# Patient Record
Sex: Female | Born: 1935 | Race: White | Hispanic: No | Marital: Married | Smoking: Never smoker
Health system: Southern US, Community
[De-identification: ages and names within clinical notes are randomized; demographics above are authoritative.]

## PROBLEM LIST (undated history)

## (undated) DIAGNOSIS — I509 Heart failure, unspecified: Secondary | ICD-10-CM

## (undated) DIAGNOSIS — K219 Gastro-esophageal reflux disease without esophagitis: Secondary | ICD-10-CM

## (undated) DIAGNOSIS — N289 Disorder of kidney and ureter, unspecified: Secondary | ICD-10-CM

## (undated) DIAGNOSIS — M1990 Unspecified osteoarthritis, unspecified site: Secondary | ICD-10-CM

## (undated) DIAGNOSIS — I10 Essential (primary) hypertension: Secondary | ICD-10-CM

## (undated) DIAGNOSIS — R040 Epistaxis: Secondary | ICD-10-CM

## (undated) DIAGNOSIS — E785 Hyperlipidemia, unspecified: Secondary | ICD-10-CM

## (undated) HISTORY — DX: Gastro-esophageal reflux disease without esophagitis: K21.9

## (undated) HISTORY — PX: KNEE SURGERY: SHX244

## (undated) HISTORY — PX: TOE SURGERY: SHX1073

## (undated) HISTORY — DX: Epistaxis: R04.0

## (undated) HISTORY — PX: CATARACT EXTRACTION: SUR2

## (undated) HISTORY — PX: TOTAL ABDOMINAL HYSTERECTOMY W/ BILATERAL SALPINGOOPHORECTOMY: SHX83

## (undated) HISTORY — DX: Unspecified osteoarthritis, unspecified site: M19.90

## (undated) HISTORY — DX: Essential (primary) hypertension: I10

## (undated) HISTORY — DX: Hyperlipidemia, unspecified: E78.5

---

## 1997-08-25 ENCOUNTER — Other Ambulatory Visit: Admission: RE | Admit: 1997-08-25 | Discharge: 1997-08-25 | Payer: Self-pay | Admitting: *Deleted

## 1998-11-02 ENCOUNTER — Encounter: Admission: RE | Admit: 1998-11-02 | Discharge: 1998-11-02 | Payer: Self-pay | Admitting: *Deleted

## 1999-11-04 ENCOUNTER — Encounter: Admission: RE | Admit: 1999-11-04 | Discharge: 1999-11-04 | Payer: Self-pay | Admitting: Family Medicine

## 1999-11-04 ENCOUNTER — Encounter: Payer: Self-pay | Admitting: Family Medicine

## 1999-11-10 ENCOUNTER — Ambulatory Visit (HOSPITAL_COMMUNITY): Admission: RE | Admit: 1999-11-10 | Discharge: 1999-11-10 | Payer: Self-pay | Admitting: Gastroenterology

## 2000-11-16 ENCOUNTER — Encounter: Payer: Self-pay | Admitting: Family Medicine

## 2000-11-16 ENCOUNTER — Encounter: Admission: RE | Admit: 2000-11-16 | Discharge: 2000-11-16 | Payer: Self-pay | Admitting: Family Medicine

## 2001-07-12 ENCOUNTER — Encounter: Payer: Self-pay | Admitting: Obstetrics and Gynecology

## 2001-07-17 ENCOUNTER — Inpatient Hospital Stay (HOSPITAL_COMMUNITY): Admission: RE | Admit: 2001-07-17 | Discharge: 2001-07-20 | Payer: Self-pay | Admitting: Obstetrics and Gynecology

## 2003-01-30 ENCOUNTER — Other Ambulatory Visit: Admission: RE | Admit: 2003-01-30 | Discharge: 2003-01-30 | Payer: Self-pay | Admitting: Obstetrics and Gynecology

## 2004-04-08 ENCOUNTER — Emergency Department (HOSPITAL_COMMUNITY): Admission: EM | Admit: 2004-04-08 | Discharge: 2004-04-08 | Payer: Self-pay | Admitting: Emergency Medicine

## 2005-07-27 ENCOUNTER — Emergency Department (HOSPITAL_COMMUNITY): Admission: EM | Admit: 2005-07-27 | Discharge: 2005-07-28 | Payer: Self-pay | Admitting: Emergency Medicine

## 2009-07-13 ENCOUNTER — Inpatient Hospital Stay (HOSPITAL_COMMUNITY): Admission: EM | Admit: 2009-07-13 | Discharge: 2009-07-15 | Payer: Self-pay | Admitting: Emergency Medicine

## 2009-07-13 ENCOUNTER — Ambulatory Visit: Payer: Self-pay | Admitting: Vascular Surgery

## 2009-07-13 ENCOUNTER — Encounter (INDEPENDENT_AMBULATORY_CARE_PROVIDER_SITE_OTHER): Payer: Self-pay | Admitting: Emergency Medicine

## 2009-08-09 ENCOUNTER — Inpatient Hospital Stay (HOSPITAL_COMMUNITY): Admission: AD | Admit: 2009-08-09 | Discharge: 2009-08-13 | Payer: Self-pay | Admitting: Cardiology

## 2009-08-09 ENCOUNTER — Ambulatory Visit: Payer: Self-pay | Admitting: Diagnostic Radiology

## 2009-08-09 ENCOUNTER — Encounter: Payer: Self-pay | Admitting: Emergency Medicine

## 2009-08-09 ENCOUNTER — Ambulatory Visit: Payer: Self-pay | Admitting: Internal Medicine

## 2009-08-10 ENCOUNTER — Encounter: Payer: Self-pay | Admitting: Cardiology

## 2009-09-15 DIAGNOSIS — I10 Essential (primary) hypertension: Secondary | ICD-10-CM | POA: Insufficient documentation

## 2009-09-15 DIAGNOSIS — M129 Arthropathy, unspecified: Secondary | ICD-10-CM | POA: Insufficient documentation

## 2009-09-15 DIAGNOSIS — R040 Epistaxis: Secondary | ICD-10-CM | POA: Insufficient documentation

## 2009-09-15 DIAGNOSIS — R079 Chest pain, unspecified: Secondary | ICD-10-CM

## 2009-09-15 DIAGNOSIS — K219 Gastro-esophageal reflux disease without esophagitis: Secondary | ICD-10-CM

## 2009-09-15 DIAGNOSIS — E785 Hyperlipidemia, unspecified: Secondary | ICD-10-CM | POA: Insufficient documentation

## 2009-09-20 ENCOUNTER — Ambulatory Visit: Payer: Self-pay | Admitting: Cardiology

## 2009-09-20 DIAGNOSIS — I428 Other cardiomyopathies: Secondary | ICD-10-CM | POA: Insufficient documentation

## 2009-09-22 ENCOUNTER — Encounter (INDEPENDENT_AMBULATORY_CARE_PROVIDER_SITE_OTHER): Payer: Self-pay | Admitting: *Deleted

## 2009-10-08 ENCOUNTER — Ambulatory Visit (HOSPITAL_COMMUNITY): Admission: RE | Admit: 2009-10-08 | Discharge: 2009-10-08 | Payer: Self-pay | Admitting: Cardiology

## 2009-10-08 ENCOUNTER — Ambulatory Visit: Payer: Self-pay

## 2009-10-08 ENCOUNTER — Encounter: Payer: Self-pay | Admitting: Cardiology

## 2009-10-08 ENCOUNTER — Ambulatory Visit: Payer: Self-pay | Admitting: Internal Medicine

## 2009-11-17 ENCOUNTER — Encounter: Payer: Self-pay | Admitting: Cardiology

## 2010-02-20 LAB — CONVERTED CEMR LAB
ALT: 8 units/L (ref 0–35)
AST: 19 units/L (ref 0–37)
Albumin: 3.9 g/dL (ref 3.5–5.2)
Alkaline Phosphatase: 49 units/L (ref 39–117)
BUN: 28 mg/dL — ABNORMAL HIGH (ref 6–23)
Bilirubin, Direct: 0.2 mg/dL (ref 0.0–0.3)
CO2: 30 meq/L (ref 19–32)
Calcium: 9.4 mg/dL (ref 8.4–10.5)
Chloride: 103 meq/L (ref 96–112)
Cholesterol: 162 mg/dL (ref 0–200)
Creatinine, Ser: 0.7 mg/dL (ref 0.4–1.2)
GFR calc non Af Amer: 87.16 mL/min (ref >60)
Glucose, Bld: 92 mg/dL (ref 70–99)
HDL: 45.7 mg/dL (ref >39.00)
LDL Cholesterol: 104 mg/dL — ABNORMAL HIGH (ref 0–99)
Potassium: 4 meq/L (ref 3.5–5.1)
Sodium: 141 meq/L (ref 135–145)
Total Bilirubin: 0.6 mg/dL (ref 0.3–1.2)
Total CHOL/HDL Ratio: 4
Total Protein: 6.9 g/dL (ref 6.0–8.3)
Triglycerides: 64 mg/dL (ref 0.0–149.0)
VLDL: 12.8 mg/dL (ref 0.0–40.0)

## 2010-02-22 NOTE — Miscellaneous (Signed)
Summary: [facility] Documentation   [facility] Face to Face Documentation   Imported By: Roderic Ovens 11/29/2009 09:05:50  _____________________________________________________________________  External Attachment:    Type:   Image     Comment:   External Document

## 2010-02-22 NOTE — Assessment & Plan Note (Signed)
Summary: eph per Bjorn Loser call/lg   Visit Type:  New Pt Primary Provider:  Dr. [name]  CC:  no complaints.  History of Present Illness: Pleasant female recently admitted with a non-ST elevation myocardial infarction. Cardiac catheterization performed in July 2011 revealed an ejection fraction of 45% but no obstructive coronary disease. It was felt that she had a possible takotsubo cardiomyopathy. An echocardiogram in July of 2011 showed an ejection fraction of 40-45% with apical hypokinesis. There was mild mitral regurgitation.  She has been treated medically. Since discharge the patient denies any dyspnea on exertion, orthopnea, PND, palpitations, syncope or chest pain. She does have chronic pedal edema which is unchanged.   Allergies (verified): 1)  ! Penicillin 2)  ! Sulfa 3)  ! Latex Exam Gloves (Disposable Gloves)  Past History:  Family History: Last updated: 2009-10-11  Mother died at the young age from colon cancer.  Father   with no known coronary artery disease.  She does not have any siblings.   No history of premature coronary artery disease or sudden cardiac death.   Social History: Last updated: 10-11-09   She [place] in [place], [place] with her husband   and 11 outdoor cats.  She does not use any tobacco products and is a   lifelong nonsmoker.  She does not use any drugs and denies any EtOH.   Her only exercise is physical therapy about 3 times a week.  She walks   with a walker and really has not been out window-shopping, not in some 2   years.  She states that on baseline she can ambulate from room to room   with the aid of a walker and does that with minimal fatigue and this is   unchanged.  Previously, she did work out side of the home in her younger   years with the [employer] and then with [employer].      Past Medical History: DYSLIPIDEMIA HYPERTENSION ARTHRITIS  GERD  EPISTAXIS Possible tako tsubo Cardiomyopathy  Past  Surgical History: Reviewed history from October 11, 2009 and no changes required. 1. Status post total abdominal hysterectomy with bilateral    salpingo-oophorectomy at age 3 or 77 years old. 2. Status post bladder surgery for bladder prolapse 10 years ago. 3. Status post bladder polypectomies approximately 20 years ago. 4. Status post toe surgery. 5. Status post right knee surgery for cartilage repair in the 1990s.  Social History: Reviewed history from 2009/10/11 and no changes required.   She lives in [place], [place] with her husband   and 11 outdoor cats.  She does not use any tobacco products and is a   lifelong nonsmoker.  She does not use any drugs and denies any EtOH.   Her only exercise is physical therapy about 3 times a week.  She walks   with a walker and really has not been out window-shopping, not in some 2   years.  She states that on baseline she can ambulate from room to room   with the aid of a walker and does that with minimal fatigue and this is   unchanged.  Previously, she did work out side of the home in her younger   years with the [employer] and then with [employer].      Review of Systems       Chronic pedal edema and lower extremity ulcers but no fevers or chills, productive cough, hemoptysis, dysphasia, odynophagia, melena, hematochezia, dysuria, hematuria,  rash, seizure activity, orthopnea, PND,  claudication. Remaining systems are negative.   Vital Signs:  Patient profile:   77 year old female Height:      67.75 inches Weight:      163 pounds BMI:     25.06 Pulse rate:   59 / minute BP sitting:   130 / 72  (right arm) Cuff size:   regular  Physical Exam  General:  Well-developed well-nourished in no acute distress.  Skin is warm and dry.  HEENT is normal.  Neck is supple. No thyromegaly.  Chest is clear to auscultation with normal expansion.  Cardiovascular exam is regular rate and rhythm.  Abdominal exam nontender or  distended. No masses palpated. Extremities show trace edema. Lower extremity dressing was in place. neuro grossly intact    EKG  Procedure date:  09/20/2009  Findings:      sinus rhythm at a rate of 73. Axis normal. Prior anterior infarct. Inferolateral T-wave inversion.  Impression & Recommendations:  Problem # 1:  CARDIOMYOPATHY (ICD-425.4) Patient has presumed takotsubo cardiomyopathy. Plan continue present medications. Check potassium and renal function. Repeat echocardiogram to see if LV function has improved and wall motion abnormality resolved. Orders: TLB-BMP (Basic Metabolic Panel-BMET) (80048-METABOL) TLB-Lipid Panel (80061-LIPID) TLB-Hepatic/Liver Function Pnl (80076-HEPATIC) Echocardiogram (Echo)  Problem # 2:  DYSLIPIDEMIA (ICD-272.4) Continue statin. Check lipids and liver.  Problem # 3:  HYPERTENSION (ICD-401.9) Blood pressure controlled on present medications. Will continue. Check potassium and renal function.  Other Orders: EKG w/ Interpretation (93000)  Patient Instructions: 1)  Your physician recommends that you schedule a follow-up appointment in: 6months with Dr. Jens Som 2)  Your physician recommends that you haveFASTING lipid profile, liver, and bmet today 3)  Your physician recommends that you continue on your current medications as directed. Please refer to the Current Medication list given to you today. 4)  Your physician has requested that you have an echocardiogram.  Echocardiography is a painless test that uses sound waves to create images of your heart. It provides your doctor with information about the size and shape of your heart and how well your heart's chambers and valves are working.  This procedure takes approximately one hour. There are no restrictions for this procedure.

## 2010-02-22 NOTE — Letter (Signed)
Summary: Custom - Lipid  [facility], Main Office  [address]   [place], [place] [address]   Phone: [phone]  Fax: [phone]     September 22, 2009 MRN: [id number]   [facility] [address] [place], [place]  [address]   Dear Ms. Wenger,  We have reviewed your cholesterol results.  They are as follows:     Total Cholesterol:    162 (Desirable: less than 200)       HDL  Cholesterol:     45.70  (Desirable: greater than 40 for men and 50 for women)       LDL Cholesterol:       104  (Desirable: less than 100 for low risk and less than 70 for moderate to high risk)       Triglycerides:       64.0  (Desirable: less than 150)  Our recommendations include:These numbers look good. Continue on the same medicine. Sodium, potassium, kidney and Liver function are normal. Take care, Dr. Darel Hong.    Call our office at the number listed above if you have any questions.  Lowering your LDL cholesterol is important, but it is only one of a large number of "risk factors" that may indicate that you are at risk for heart disease, stroke or other complications of hardening of the arteries.  Other risk factors include:   A.  Cigarette Smoking* B.  High Blood Pressure* C.  Obesity* D.   Low HDL Cholesterol (see yours above)* E.   Diabetes Mellitus (higher risk if your is uncontrolled) F.  Family history of premature heart disease G.  Previous history of stroke or cardiovascular disease    *These are risk factors YOU HAVE CONTROL OVER.  For more information, visit .  There is now evidence that lowering the TOTAL CHOLESTEROL AND LDL CHOLESTEROL can reduce the risk of heart disease.  The American Heart Association recommends the following guidelines for the treatment of elevated cholesterol:  1.  If there is now current heart disease and less than two risk factors, TOTAL CHOLESTEROL should be less than 200 and LDL CHOLESTEROL should be less than 100. 2.  If there is  current heart disease or two or more risk factors, TOTAL CHOLESTEROL should be less than 200 and LDL CHOLESTEROL should be less than 70.  A diet low in cholesterol, saturated fat, and calories is the cornerstone of treatment for elevated cholesterol.  Cessation of smoking and exercise are also important in the management of elevated cholesterol and preventing vascular disease.  Studies have shown that 30 to 60 minutes of physical activity most days can help lower blood pressure, lower cholesterol, and keep your weight at a healthy level.  Drug therapy is used when cholesterol levels do not respond to therapeutic lifestyle changes (smoking cessation, diet, and exercise) and remains unacceptably high.  If medication is started, it is important to have you levels checked periodically to evaluate the need for further treatment options.  Thank you,    [employer] Team

## 2010-04-05 ENCOUNTER — Encounter: Payer: Self-pay | Admitting: Cardiology

## 2010-04-09 LAB — CARDIAC PANEL(CRET KIN+CKTOT+MB+TROPI)
CK, MB: 8.1 ng/mL (ref 0.3–4.0)
Relative Index: 2.3 (ref 0.0–2.5)
Relative Index: 2.4 (ref 0.0–2.5)
Relative Index: 2.6 — ABNORMAL HIGH (ref 0.0–2.5)
Total CK: 218 U/L — ABNORMAL HIGH (ref 7–177)
Total CK: 339 U/L — ABNORMAL HIGH (ref 7–177)
Troponin I: 8.86 ng/mL (ref 0.00–0.06)

## 2010-04-09 LAB — COMPREHENSIVE METABOLIC PANEL
AST: 56 U/L — ABNORMAL HIGH (ref 0–37)
Albumin: 3.1 g/dL — ABNORMAL LOW (ref 3.5–5.2)
Calcium: 8.5 mg/dL (ref 8.4–10.5)
Chloride: 90 mEq/L — ABNORMAL LOW (ref 96–112)
Creatinine, Ser: 0.58 mg/dL (ref 0.4–1.2)
GFR calc Af Amer: >60 mL/min (ref >60)

## 2010-04-09 LAB — BASIC METABOLIC PANEL
BUN: 17 mg/dL (ref 6–23)
BUN: 22 mg/dL (ref 6–23)
BUN: 23 mg/dL (ref 6–23)
BUN: 14 mg/dL (ref 6–23)
Calcium: 7.4 mg/dL — ABNORMAL LOW (ref 8.4–10.5)
Calcium: 8.7 mg/dL (ref 8.4–10.5)
Chloride: 95 mEq/L — ABNORMAL LOW (ref 96–112)
Chloride: 98 mEq/L (ref 96–112)
Creatinine, Ser: 0.56 mg/dL (ref 0.4–1.2)
Creatinine, Ser: 0.74 mg/dL (ref 0.4–1.2)
Creatinine, Ser: 0.75 mg/dL (ref 0.4–1.2)
Creatinine, Ser: 0.85 mg/dL (ref 0.4–1.2)
Creatinine, Ser: 0.7 mg/dL (ref 0.4–1.2)
GFR calc Af Amer: >60 mL/min (ref >60)
GFR calc Af Amer: >60 mL/min (ref >60)
GFR calc non Af Amer: >60 mL/min (ref >60)
GFR calc non Af Amer: >60 mL/min (ref >60)
Glucose, Bld: 102 mg/dL — ABNORMAL HIGH (ref 70–99)
Glucose, Bld: 107 mg/dL — ABNORMAL HIGH (ref 70–99)
Potassium: 3.8 mEq/L (ref 3.5–5.1)
Sodium: 130 mEq/L — ABNORMAL LOW (ref 135–145)

## 2010-04-09 LAB — GLUCOSE, CAPILLARY: Glucose-Capillary: 139 mg/dL — ABNORMAL HIGH (ref 70–99)

## 2010-04-09 LAB — BRAIN NATRIURETIC PEPTIDE
Pro B Natriuretic peptide (BNP): 781 pg/mL — ABNORMAL HIGH (ref 0.0–100.0)
Pro B Natriuretic peptide (BNP): 951 pg/mL — ABNORMAL HIGH (ref 0.0–100.0)

## 2010-04-09 LAB — DIFFERENTIAL
Eosinophils Relative: 1 % (ref 0–5)
Lymphocytes Relative: 15 % (ref 12–46)
Lymphs Abs: 1.1 10*3/uL (ref 0.7–4.0)
Monocytes Absolute: 0.9 10*3/uL (ref 0.1–1.0)

## 2010-04-09 LAB — POCT CARDIAC MARKERS: Myoglobin, poc: 181 ng/mL (ref 12–200)

## 2010-04-09 LAB — CBC
MCH: 31.8 pg (ref 26.0–34.0)
MCHC: 33.2 g/dL (ref 30.0–36.0)
MCHC: 33.5 g/dL (ref 30.0–36.0)
MCV: 93.9 fL (ref 78.0–100.0)
Platelets: 225 10*3/uL (ref 150–400)
Platelets: 232 10*3/uL (ref 150–400)
Platelets: 248 10*3/uL (ref 150–400)
RBC: 4.04 MIL/uL (ref 3.87–5.11)
RDW: 14.5 % (ref 11.5–15.5)
RDW: 14.9 % (ref 11.5–15.5)
WBC: 6.6 10*3/uL (ref 4.0–10.5)

## 2010-04-09 LAB — HEPARIN LEVEL (UNFRACTIONATED)
Heparin Unfractionated: 0.26 IU/mL — ABNORMAL LOW (ref 0.30–0.70)
Heparin Unfractionated: 0.38 IU/mL (ref 0.30–0.70)

## 2010-04-09 LAB — CREATININE, URINE, RANDOM: Creatinine, Urine: 17.2 mg/dL

## 2010-04-09 LAB — LIPID PANEL
Cholesterol: 118 mg/dL (ref 0–200)
LDL Cholesterol: 57 mg/dL (ref 0–99)
Triglycerides: 37 mg/dL (ref <150)

## 2010-04-10 LAB — URINE CULTURE: Culture: NO GROWTH

## 2010-04-10 LAB — URINALYSIS, ROUTINE W REFLEX MICROSCOPIC
Nitrite: NEGATIVE
Protein, ur: NEGATIVE mg/dL
Urobilinogen, UA: 0.2 mg/dL (ref 0.0–1.0)

## 2010-04-10 LAB — CK: Total CK: 211 U/L — ABNORMAL HIGH (ref 7–177)

## 2010-04-10 LAB — BRAIN NATRIURETIC PEPTIDE: Pro B Natriuretic peptide (BNP): 42 pg/mL (ref 0.0–100.0)

## 2010-04-10 LAB — COMPREHENSIVE METABOLIC PANEL
ALT: 10 U/L (ref 0–35)
Alkaline Phosphatase: 74 U/L (ref 39–117)
BUN: 37 mg/dL — ABNORMAL HIGH (ref 6–23)
CO2: 26 mEq/L (ref 19–32)
GFR calc non Af Amer: >60 mL/min (ref >60)
Glucose, Bld: 114 mg/dL — ABNORMAL HIGH (ref 70–99)
Potassium: 3.7 mEq/L (ref 3.5–5.1)
Sodium: 136 mEq/L (ref 135–145)
Total Bilirubin: 0.5 mg/dL (ref 0.3–1.2)

## 2010-04-10 LAB — CBC
HCT: 34.7 % — ABNORMAL LOW (ref 36.0–46.0)
Hemoglobin: 11.9 g/dL — ABNORMAL LOW (ref 12.0–15.0)
MCHC: 34.3 g/dL (ref 30.0–36.0)
RBC: 3.71 MIL/uL — ABNORMAL LOW (ref 3.87–5.11)

## 2010-04-10 LAB — DIFFERENTIAL
Basophils Absolute: 0 10*3/uL (ref 0.0–0.1)
Basophils Relative: 1 % (ref 0–1)
Eosinophils Absolute: 0 10*3/uL (ref 0.0–0.7)
Neutro Abs: 4.3 10*3/uL (ref 1.7–7.7)
Neutrophils Relative %: 78 % — ABNORMAL HIGH (ref 43–77)

## 2010-04-10 LAB — URINE MICROSCOPIC-ADD ON

## 2010-04-10 LAB — TROPONIN I: Troponin I: 0.02 ng/mL (ref 0.00–0.06)

## 2010-04-19 ENCOUNTER — Encounter: Payer: Self-pay | Admitting: Cardiology

## 2010-04-19 ENCOUNTER — Ambulatory Visit (INDEPENDENT_AMBULATORY_CARE_PROVIDER_SITE_OTHER): Payer: Medicare Other | Admitting: Cardiology

## 2010-04-19 VITALS — BP 164/67 | HR 61 | Resp 14 | Ht <= 58 in | Wt 144.0 lb

## 2010-04-19 DIAGNOSIS — I10 Essential (primary) hypertension: Secondary | ICD-10-CM

## 2010-04-19 MED ORDER — LISINOPRIL 10 MG PO TABS: 10.0000 mg | ORAL_TABLET | Freq: Every day | ORAL | Status: DC

## 2010-04-19 NOTE — Assessment & Plan Note (Signed)
Most recent echocardiogram continued to show apical wall motion abnormality. Question previous vasospasm versus takotsubo. Continue present medications. I will discontinue her Plavix to see if this is contributing to her diarrhea.

## 2010-04-19 NOTE — Patient Instructions (Signed)
STOP PLAVIX INCREASE LISINOPRIL 10MG  ONCE DAILY Your physician recommends that you schedule a follow-up appointment in: ONE YEAR LAB WORK IN ONE WEEK WITH PRIMARY DOCTOR

## 2010-04-19 NOTE — Assessment & Plan Note (Signed)
Blood pressure elevated. Increase lisinopril to 10 mg p.o. Daily. Check potassium and renal function in one week.

## 2010-04-19 NOTE — Progress Notes (Signed)
HPI: Pleasant female previously admitted with a non-ST elevation myocardial infarction. Cardiac catheterization performed in July 2011 revealed an ejection fraction of 45% but no obstructive coronary disease. It was felt that she had a possible takotsubo cardiomyopathy. An echocardiogram in July of 2011 showed an ejection fraction of 40-45% with apical hypokinesis. There was mild mitral regurgitation.  She has been treated medically. Followup echocardiogram in September of 2011 showed an ejection fraction of 50% and apical akinesis. I last saw her in August of 2011. Since then,  the patient denies any dyspnea on exertion, orthopnea, PND, palpitations, syncope or chest pain. She does have chronic pedal edema which is unchanged.  Current Outpatient Prescriptions  Medication Sig Dispense Refill  . alendronate (FOSAMAX) 70 MG tablet Take 70 mg by mouth every 7 (seven) days. Take with a full glass of water on an empty stomach.       Marland Kitchen aspirin 325 MG tablet Take 325 mg by mouth daily.        . clopidogrel (PLAVIX) 75 MG tablet Take 75 mg by mouth daily.        . furosemide (LASIX) 20 MG tablet Take 20 mg by mouth daily.        Marland Kitchen lisinopril (PRINIVIL,ZESTRIL) 2.5 MG tablet Take 2.5 mg by mouth daily.        . metoprolol succinate (TOPROL-XL) 25 MG 24 hr tablet Take 25 mg by mouth daily.        Marland Kitchen oxybutynin (DITROPAN) 5 MG tablet Take 5 mg by mouth 3 (three) times daily.        . potassium chloride (KLOR-CON) 10 MEQ CR tablet Take 10 mEq by mouth daily.        . simvastatin (ZOCOR) 20 MG tablet Take 20 mg by mouth at bedtime.           Past Medical History  Diagnosis Date  . Dyslipidemia   . HTN (hypertension)   . Arthritis   . GERD (gastroesophageal reflux disease)   . Epistaxis     Past Surgical History  Procedure Date  . Total abdominal hysterectomy w/ bilateral salpingoophorectomy   . Toe surgery   . Knee surgery 90's    History   Social History  . Marital Status: Married    Spouse  Name: N/A    Number of Children: N/A  . Years of Education: N/A   Occupational History  . Not on file.   Social History Main Topics  . Smoking status: Never Smoker   . Smokeless tobacco: Not on file  . Alcohol Use: No  . Drug Use: No  . Sexually Active: Not on file   Other Topics Concern  . Not on file   Social History Narrative  . No narrative on file    ROS: diarrhea but no fevers or chills, productive cough, hemoptysis, dysphasia, odynophagia, melena, hematochezia, dysuria, hematuria, rash, seizure activity, orthopnea, PND, claudication. Remaining systems are negative.  Physical Exam: Well-developed well-nourished in no acute distress.  Skin is warm and dry.  HEENT is normal.  Neck is supple. No thyromegaly.  Chest is clear to auscultation with normal expansion.  Cardiovascular exam is regular rate and rhythm.  Abdominal exam nontender or distended. No masses palpated. Extremities show 1+ edema and chronic skin changes. neuro grossly intact  ECG Normal sinus rhythm at a rate of 61. Rule out prior septal infarct. Anteroseptal T wave changes.

## 2010-04-19 NOTE — Assessment & Plan Note (Signed)
Continue statin. Check lipids and liver. 

## 2010-06-10 NOTE — Discharge Summary (Signed)
[facility]  Patient:    Emily Camacho, Emily Camacho Visit Number: [id number] MRN: [id number]          Service Type: GYN Location: 4W [id number] Attending Physician:  Melony Overly Dictated by:   Devoria Albe Edward Jolly, [name] Admit Date:  07/17/2001 Discharge Date: 07/20/2001   CC:         Emily Camacho, [name]  [name], [name]   Discharge Summary  ADMISSION DIAGNOSES: 1. Complete vaginal vault eversion. 2. Genuine stress incontinence. 3. Anemia.  DISCHARGE DIAGNOSES: 1. Status post anterior and posterior colporrhaphy, enterocele repair,    iliococcygeus vaginal vault suspension, anterior vaginal wall in situ    sling, cystoscopy. 2. Status post transfusion of 2 units of packed red blood cells    intraoperatively.  SIGNIFICANT OPERATIONS AND PROCEDURES:  The patient underwent an anterior and posterior colporrhaphy with enterocele repair, iliococcygeus vaginal vault suspension, anterior vaginal wall in situ sling, and cystoscopy at [facility] on July 17, 2001 under the direction of Dr. Conley Simmonds and with the assistance of Dr. Artist Pais.  The patient is status post transfusion of 2 units of packed red blood cells.  PERTINENT ADMISSION HISTORY AND PHYSICAL EXAMINATION:  The patient was a 77 year old gravida 6 para 5-0-1-5 Caucasian female, status post total abdominal hysterectomy with bilateral salpingo-oophorectomy and status post prior surgery for bladder prolapse, who presented to the office stating a 10-year history of bladder prolapse.  On physical examination, the patient was noted to have complete vaginal vault eversion.  She underwent multichannel urodynamic testing with a vaginal packing in place, and occult genuine stress incontinence was diagnosed.  The patient was admitted on July 17, 2001 at which time she underwent an anterior and posterior colporrhaphy with a very small enterocele repair, an iliococcygeus vaginal vault suspension,  and an anterior vaginal wall in situ sling with cystoscopy.  The patient was transfused intraoperatively with 2 units of packed red blood cells because of her preoperative hemoglobin of 8.8 and her history of anemia along with an estimated blood loss total from surgery of 700 cc in combination with hypotension and decreased urinary output.  There were no complications during the surgical procedure.  The evening of surgery a hemoglobin was checked and found to be 8.9 following the transfusion of 2 units of packed red blood cells.  The patient at her postoperative check was found to be hemodynamically stable and with good clear urine output.  The remainder of the patients postoperative course was relatively unremarkable.  The patient initially had her pain controlled with a morphine PCA which was successfully switched over to Percocet and ibuprofen to control her pain well.  The patients diet was slowly advanced to normal which she tolerated without difficulty.  The patient initially was ambulating with the assistance of a nurse and her walker but eventually was able to ambulate independently in and out of bed using the walker.  The patients discharge hemoglobin was noted to be 8.1 and she was tolerating this well at the time of discharge.  The patient was started on iron sulfate 300 mg p.o. t.i.d. during the hospitalization.  Postoperatively, on July 18, 2001, the patient was diagnosed with hypokalemia with a potassium level of 3.3.  Initially, she received IV potassium replacement with 20 mEq and this was converted to 10 mEq of Kay Ciel p.o. b.i.d. when she was taking fluids and food well.  Her discharge potassium was 3.5.  On postoperative day #2, the process  of clamping and unclamping the Foley catheter every 3 hours was begun.  The patient did demonstrate understanding of this process and the use of the Foley catheter prior to her discharge.  The patient was found to be in good  condition and ready for discharge on postoperative day #3.  Instructions at discharge include the following: The patients staples will be removed and Steri-Strips will be placed over the incision prior to her discharge.  She will then be discharged to home.  She will have decreased activity for the next 6 weeks.  She will continue with clamping and unclamping of the Foley catheter every 3 hours while awake at home.  The patient will take a regular diet.  Her prescriptions and recommended medications included Percocet one to two p.o. q.4-6h. p.r.n. pain. Ibuprofen 600 mg p.o. q.6h. p.r.n. pain.  Elemental iron 60 mg p.o. t.i.d. with meals.  Colace 100 mg p.o. b.i.d. p.r.n. constipation.  The patient will also resume her usual medications and her usual dosages.  The patient will have a home health care visit on June 29 or July 22, 2001.  She will follow up in the office in 4 days to assess her bladder training.  She will call if she experiences any problems with fever, increased vaginal bleeding, increased pain, or any other concern. Dictated by:   Devoria Albe Edward Jolly, [name] Attending Physician:  Melony Overly DD:  07/20/01 TD:  07/22/01 Job: [id number]

## 2010-06-10 NOTE — H&P (Signed)
[facility]  Patient:    Emily Camacho, Emily Camacho Visit Number: [id number] MRN: [id number]          Service Type: Attending:  Debbe Bales A. Edward Jolly, [name] Dictated by:   Devoria Albe Edward Jolly, [name]                           History and Physical  DATE OF BIRTH:  [birth year]  CHIEF COMPLAINT: 1. Bladder prolapse. 2. Urinary incontinence.  HISTORY OF PRESENT ILLNESS:  The patient is a 77 year old, gravida 38, para 5-0-1-5 Caucasian female, status post total abdominal hysterectomy with bilateral salpingo-oophorectomy at approximately age 19 to 10 who presented to the office in December 2002 reporting bladder prolapse of 10 years duration. The patient reported that she underwent a surgery for bladder prolapse approximately 10 years prior but that her symptoms had recurred, and she requested surgical evaluation and treatment.  The patient also reported leakage of urine with coughing and sneezing and urinary urgency with urge incontinence.  She wore a pad intermittently.  The patient did report a history of urinary tract infections as a young woman and she denied a history of pyelonephritis.  She did report a history of nephrolithiasis.  The patient also had a history of bladder polyps which were removed approximately 20 years prior and were benign.  The patient did undergo multichannel urodynamic testing in the office which documented the evidence of genuine stress incontinence and abdominal leak-point pressure of 8 to 19 cm of water and a urethral pressure profile of 20 cm of water.  The patient had a stable CMG.  She had an intermittent voiding pattern and a post-void residual of 40 cc.  The patient denied a history of constipation or fecal incontinence.  The patients past gynecologic and obstetric histories were remarkable for five vaginal deliveries.  The patients largest child at birth weighed 8 pounds and 13 ounces.  The patient is status post total abdominal  hysterectomy with bilateral salpingo-oophorectomy for abnormal uterine bleeding.  She reports that the pathology report was benign.  The patient is also status post bladder surgery 10 years prior for a cystocele.  The patients last Pap smear was performed in 2001 and was normal.  The patients last mammogram was performed in October 2002 and was within normal limits.  The patient currently does take Premarin 0.625 mg p.o. q.d.  PAST MEDICAL HISTORY: 1. Hypertension. 2. Gastroesophageal reflux. 3. Arthritis. 4. Hypercholesterolemia.  PAST SURGICAL HISTORY: 1. Status post total abdominal hysterectomy with bilateral    salpingo-oophorectomy at age 62 or 77 years old. 2. Status post bladder surgery for bladder prolapse 10 years ago. 3. Status post bladder polypectomies approximately 20 years ago. 4. Status post toe surgery. 5. Status post right knee surgery for cartilage repair in the 1990s.  MEDICATIONS: 1. Premarin 0.625 mg p.o. q.d. 2. Lisinopril/hydrochlorothiazide 20-25 mg p.o. q.d. 3. Norvasc 5 mg p.o. q.d. 4. ASA one p.o. q.d. 5. Niaspan.  ALLERGIES:  PENICILLIN and SULFA.  SOCIAL HISTORY:  The patient is married.  She is a retired Engineer, petroleum. She denies the use of tobacco, alcohol, or illicit drugs.  FAMILY HISTORY:  The patients mother died of colon cancer at age 40.  The family history is negative for breast, ovarian, or prostate cancer.  REVIEW OF SYSTEMS:  Please refer to the history of present illness.  PHYSICAL EXAMINATION:  GENERAL:  The patient is an elderly Caucasian female who uses a walker  to assist with ambulation.  VITAL SIGNS:  Blood pressure 130/62.  NECK:  Negative for adenopathy and thyromegaly.  LUNGS:  Clear to auscultation bilaterally.  There are mild rales located at the base of the right lower lobe.  HEART:  S1, S2 has a regular rate and rhythm and no evidence of a murmur, rub, or gallop.  ABDOMEN:  There is a midline infraumbilical  well-healed incision without evidence of hernia.  The abdomen is soft and nontender and without evidence of hepatosplenomegaly or organomegaly.  PELVIC:  There is evidence of complete vaginal vault eversion.  The vaginal mucosa is without evidence of any ulcers or excoriations.  Bimanual examination with replacement of the prolapse documents the absence of midline and adnexal masses and tenderness.  EXTREMITIES:  Exam documents 1+ right leg edema.  Both of the legs demonstrate varicosities.  ASSESSMENT:  The patient is a 77 year old, gravida 40, para 5-0-1-5 Caucasian female, who presents with complete vaginal vault eversion and stress incontinence.  The patient wishes for surgical treatments of the above.  PLAN:  The plan will be for the patient to undergo an anterior and posterior colporrhaphy with possible enterocele repair, and anterior vaginal wall in situ sling with cystoscopy and suprapubic catheter placement in addition to an iliococcygeus vaginal vault suspension at [facility] on July 17, 2001.  Risks, benefits, and alternatives have been discussed with the patient who wishes to proceed. Dictated by:   Devoria Albe Edward Jolly, [name] Attending:  Devoria Albe. Edward Jolly, [name] DD:  07/16/01 TD:  07/16/01 Job: [id number]

## 2010-06-10 NOTE — Procedures (Signed)
[facility]  Patient:    Emily Camacho, Emily Camacho                          MRN: [id number] Proc. Date: 11/10/99 Attending:  Verlin Grills, [name] CC:         Lum Babe, [name]                           Procedure Report  PROCEDURE:  Colonoscopy.  REFERRING PHYSICIAN:  Lum Babe, [name], [facility].  PROCEDURE INDICATION:  [name] is a 77 year old female with unexplained iron deficiency anemia, referred for a diagnostic colonoscopy.  I discussed with [name] the complications associated with colonoscopy and polypectomy, including a one per thousand risk of intestinal bleeding and a four per thousand risk of intestinal perforation requiring surgery.  [name] has signed the operative permit.  ENDOSCOPIST:  Verlin Grills, [name]  PREMEDICATION:  Demerol 50 mg, Versed 7 mg.  ENDOSCOPE:  Olympus pediatric colonoscope.  DESCRIPTION OF PROCEDURE:  After obtaining informed consent, the patient was placed in the left lateral decubitus position.  I administered intravenous Demerol and intravenous Versed to achieve conscious sedation for the procedure.  The patients blood pressure, oxygen saturation, and cardiac rhythm were monitored throughout the procedure and are documented in the medical record.  Anal inspection and digital rectal examination revealed a small thrombosed external hemorrhoid.  The Olympus pediatric colonoscope was introduced into the rectum and under direct vision advanced to the cecum, identified by a normal-appearing ileocecal valve.  Colonic preparation for the exam today was excellent.  Rectum normal.  Sigmoid colon and descending colon normal.  Splenic flexure normal.  Transverse colon normal.  Hepatic flexure normal.  Ascending colon normal.  Cecum and ileocecal valve normal.  ASSESSMENT:  Normal proctocolonoscopy to the cecum.  No evidence of the presence of colorectal neoplasia.DD:   11/10/99 TD:  11/10/99 Job: [id number]

## 2010-06-10 NOTE — Op Note (Signed)
[facility]  Patient:    Emily Camacho, Emily Camacho Visit Number: [id number] MRN: [id number]          Service Type: GYN Location: 4W 0460 02 Attending Physician:  Melony Overly Dictated by:   Devoria Albe Edward Jolly, [name] Proc. Date: 07/17/01 Admit Date:  07/17/2001 Discharge Date: 07/20/2001                             Operative Report  PREOPERATIVE DIAGNOSES: 1. Complete vaginal vault eversion. 2. Genuine stress incontinence.  POSTOPERATIVE DIAGNOSES: 1. Complete vaginal vault eversion. 2. Genuine stress incontinence.  PROCEDURE:  Anterior and posterior colporrhaphy, enterocele repair, iliococcygeus vaginal vault suspension, anterior vaginal vault in situ sling, cystoscopy.  SURGEON:  Brook A. Edward Jolly, [name]  ASSISTANT:  Beather Arbour. Thomasena Edis, [name]  ANESTHESIA:  General endotracheal.  IV FLUIDS:  2900 cc Ringers lactate, 500 cc Hespan, 2 units of packed red blood cells.  ESTIMATED BLOOD LOSS:  700 cc.  URINE OUTPUT:  350 cc.  COMPLICATIONS:  None.  INDICATIONS FOR PROCEDURE:  The patient is a 77 year old, gravida 57, para 5-0-1-5 Caucasian female, status post total abdominal hysterectomy with bilateral salpingo-oophorectomy who presented with a 10 year history of bladder protrusion for which she requested surgical repair. On physical examination, the patient was noted to have complete vaginal vault eversion. The patient did undergo multichannel urodynamic testing to evaluate for the presence of occult genuine stress incontinence based on her severe prolapse. The patient indeed did have anatomic genuine stress incontinence identified with a leak point pressure which measured between 8-19 cm of water. The patient requested surgical treatment of the prolapse and the incontinence and she agreed to her surgery after the risks, benefits, and alternatives were discussed with her.  FINDINGS:  Examination under anesthesia revealed complete vaginal vault eversion. The  cervix and uterus were absent. There were no midline nor adnexal masses appreciated.  Cystoscopy demonstrated the absence of sutures in both the urethra and the bladder. The bladder was visualized throughout 360 degrees and was noted to have a normal dome and trigone. The ureters were noted to be patent bilaterally after injection of indigo carmine dye intravenously.  Intraoperative hemoglobin measured 8.0.  DESCRIPTION OF PROCEDURE:  The patient was taken to the operating room after she was properly identified. The patient did receive both TED hose and thigh high compression stockings for DVT prophylaxis. She received clindamycin 900 mg intravenously for antibiotic prophylaxis. The patient was placed in the supine position in the operating room table and general endotracheal anesthesia was induced. She was then placed in the dorsal lithotomy position using Allen stirrups.  The lower abdomen and vagina were sterilely prepped and the bladder was sterilely catheterized of urine using a Foley catheter. The patient was then sterilely draped.  The procedure began by marking the proposed new vaginal apices bilaterally by placing a figure-of-eight suture of #0 Vicryl. Attention was then turned to the posterior vagina where Allis clamps were placed at the introitus marking it bilaterally. Allis clamps were then placed in the midline of the vagina up to the level of the proposed vaginal apex. A triangular wedge of tissue was sharply excised from the perineum after the perineum was injected with 0.5% lidocaine with 1:200,000 of epinephrine. The injection was then continued vertically along the posterior vaginal wall. The vagina was then incised in the midline using a Mayo scissors up to the level of the vaginal apex, and the  perirectal fascia was dissected off of the overlying vaginal mucosa bilaterally using sharp dissection with the Mayo scissors. The midline vaginal tissue along the  anterior vaginal wall was then marked with Allis clamps and was injected locally again with 0.5% lidocaine with epinephrine. The midline vaginal incision was then extended sharply with the Mayo scissors. The subvaginal tissue was dissected off of the bladder using sharp dissection with the Mayo scissors. Please note that at the level of the vaginal apex, the cul-de-sac was entered sharply. Rectal examination and palpation of the bladder with the Foley catheter confirmed that this was the location of the enterocele. The enterocele was dissected out from the surrounding bladder and the rectum. There was no small intestine which was noted to be prolapsing within it. The small enterocele was then closed by placing a single pursestring suture of #0 Vicryl for its adequate closure.  Throughout the dissection of the vaginal mucosa, the patient was noted to have constant oozing from this site which was treated with monopolar cautery in the various locations overlying the bladder and the rectum. During the patients surgery, she did demonstrate decreased urine output as well as hypotension and a request was made to measure a hemoglobin which was 8.0. Based on the patients clinical status and her preoperative and intraoperative anemia, a decision was made to proceed with a transfusion of 2 units of packed red blood cells by both the surgical and the anesthesia team.  The proposed site of the in situ vaginal sling was then identified and marked with a marking pen.  There was a 2 x 1 cm patch of tissue which overlay the mid urethra. The patch was dissected from the surrounding vaginal mucosa using sharp dissection with both the scalpel and with Metzenbaum scissors. The borders of the sling were then whipped stitched with #1 Prolene suture bilaterally.  A transverse suprapubic incision was then created sharply with the scalpel and the incision was carried down to the pubic symphysis and insertion  sites of the rectus fascia using monopolar cautery.   The Stamey needle was then used to pass through the retropubic space on the patients right hand side and out through the vagina. The sutures from the sling on the ipsilateral side were then brought up through the suprapubic incision. The same procedure that was performed on the patients right hand side was then repeated on the patients left hand side. One free arm of each suture was then brought through the rectus fascia directly overlying the pubic symphysis in the suprapubic incision.  Cystoscopy was performed at this time and the findings are as noted above. A sterile Q-tip was then placed inside the urethra after the bladder was drained of any remaining fluid for cystoscopy. The sling sutures were elevated such that there was tension created on the sling site and the sutures were then tied bilaterally through the rectus fascia.  The Foley catheter was replaced inside the urethra at this time and the anterior colporrhaphy was performed by placing a combination of horizontal and vertical mattress sutures of #0 Vicryl. At the level of the vaginal apex, the iliococcygeus vaginal vault suspension sutures were placed each using #0 Vicryl. The #0 Vicryl suture was brought through the vaginal mucosa the apex site on the patients left hand side, brought through the iliococcygeus musculature on the same side and back out through the vaginal apex and held until it was tied at the end of the case. The same procedure that was performed on  the left hand side was then repeated on the right hand side. A series of vertical mattress sutures of #0 Vicryl were then placed over the perirectal fascia posteriorly to complete the posterior colporrhaphy. A crown stitch was placed in the perineum to provide additional support of the perineal body.  Excess vaginal mucosa was then trimmed away bilaterally and the vagina was closed by placing a series of  running locked sutures of 2-0 Vicryl. The iliococcygeus sutures were tied and there was good elevation of the vaginal apex bilaterally. The remainder of the perineal incision was then closed as in standard fashion for an episiotomy using 2-0 Vicryl.  An iodoform gauze packing was placed inside the vagina. The suprapubic incision was examined and small bleeding vessels were cauterized using monopolar cautery. Staples were then placed over the skin followed by a sterile bandage. A Foley catheter was left to gravity drainage.  This concluded the patients procedure. There were no complications. All needle, instrument, and sponge counts were correct. Dictated by:   Devoria Albe Edward Jolly, [name] Attending Physician:  Melony Overly DD:  07/17/01 TD:  07/19/01 Job: [id number]

## 2010-07-07 ENCOUNTER — Encounter: Payer: Self-pay | Admitting: Cardiology

## 2010-08-08 ENCOUNTER — Other Ambulatory Visit: Payer: Self-pay | Admitting: Cardiology

## 2010-10-05 ENCOUNTER — Other Ambulatory Visit: Payer: Self-pay | Admitting: *Deleted

## 2010-10-05 MED ORDER — METOPROLOL SUCCINATE ER 25 MG PO TB24: 25.0000 mg | ORAL_TABLET | Freq: Every day | ORAL | Status: DC

## 2010-10-05 MED ORDER — FUROSEMIDE 20 MG PO TABS: 20.0000 mg | ORAL_TABLET | Freq: Every day | ORAL | Status: DC

## 2011-04-11 DIAGNOSIS — I10 Essential (primary) hypertension: Secondary | ICD-10-CM | POA: Diagnosis not present

## 2011-04-11 DIAGNOSIS — Z1331 Encounter for screening for depression: Secondary | ICD-10-CM | POA: Diagnosis not present

## 2011-04-11 DIAGNOSIS — E7800 Pure hypercholesterolemia, unspecified: Secondary | ICD-10-CM | POA: Diagnosis not present

## 2011-05-12 ENCOUNTER — Other Ambulatory Visit: Payer: Self-pay | Admitting: Cardiology

## 2011-08-29 ENCOUNTER — Encounter: Payer: Self-pay | Admitting: Cardiology

## 2011-08-29 ENCOUNTER — Ambulatory Visit (INDEPENDENT_AMBULATORY_CARE_PROVIDER_SITE_OTHER): Payer: Medicare Other | Admitting: Cardiology

## 2011-08-29 VITALS — BP 161/79 | HR 71 | Ht <= 58 in | Wt 137.0 lb

## 2011-08-29 DIAGNOSIS — I10 Essential (primary) hypertension: Secondary | ICD-10-CM | POA: Diagnosis not present

## 2011-08-29 MED ORDER — LISINOPRIL 40 MG PO TABS: 40.0000 mg | ORAL_TABLET | Freq: Every day | ORAL | Status: DC

## 2011-08-29 NOTE — Assessment & Plan Note (Signed)
Continue statin. Managed by primary care. 

## 2011-08-29 NOTE — Assessment & Plan Note (Signed)
Blood pressure elevated. Increase lisinopril to 40 mg daily. Check potassium and renal function in one week. 

## 2011-08-29 NOTE — Progress Notes (Signed)
WUJ:[id number] female previously admitted with a non-ST elevation myocardial infarction. Cardiac catheterization performed in July 2011 revealed an ejection fraction of 45% but no obstructive coronary disease. It was felt that she had a possible takotsubo cardiomyopathy. An echocardiogram in July of 2011 showed an ejection fraction of 40-45% with apical hypokinesis. There was mild mitral regurgitation. She has been treated medically. Followup echocardiogram in September of 2011 showed an ejection fraction of 50% and apical akinesis. I last saw her in March of 2012. Since then, the patient denies any dyspnea on exertion, orthopnea, PND, palpitations, syncope or chest pain. She does have chronic pedal edema which is unchanged.   Current Outpatient Prescriptions  Medication Sig Dispense Refill  . aspirin 325 MG tablet Take 325 mg by mouth daily.        . Calcium 45 MG CAPS daily       . Cholecalciferol (VITAMIN D) 400 UNITS capsule Take 400 Units by mouth 2 (two) times daily.        . furosemide (LASIX) 20 MG tablet TAKE 1 TABLET (20 MG TOTAL) BY MOUTH DAILY.  30 tablet  6  . lisinopril (PRINIVIL,ZESTRIL) 10 MG tablet Take 20 mg by mouth daily.      . metoprolol succinate (TOPROL-XL) 25 MG 24 hr tablet Take 50 mg by mouth daily.      . Multiple Vitamins-Minerals (CENTRUM PO) 1 tab po qd       . oxybutynin (DITROPAN) 5 MG tablet Take 5 mg by mouth 3 (three) times daily.        . simvastatin (ZOCOR) 20 MG tablet Take 20 mg by mouth at bedtime.        Marland Kitchen DISCONTD: lisinopril (PRINIVIL,ZESTRIL) 10 MG tablet Take 1 tablet (10 mg total) by mouth daily.  30 tablet  12  . DISCONTD: metoprolol succinate (TOPROL-XL) 25 MG 24 hr tablet Take 1 tablet (25 mg total) by mouth daily.  30 tablet  6     Past Medical History  Diagnosis Date  . Dyslipidemia   . HTN (hypertension)   . Arthritis   . GERD (gastroesophageal reflux disease)   . Epistaxis   . Cardiomyopathy     tako tsubo    Past Surgical History    Procedure Date  . Total abdominal hysterectomy w/ bilateral salpingoophorectomy   . Toe surgery   . Knee surgery 90's    History   Social History  . Marital Status: Married    Spouse Name: N/A    Number of Children: N/A  . Years of Education: N/A   Occupational History  . Not on file.   Social History Main Topics  . Smoking status: Never Smoker   . Smokeless tobacco: Never Used  . Alcohol Use: No  . Drug Use: No  . Sexually Active: Not on file   Other Topics Concern  . Not on file   Social History Narrative  . No narrative on file    ROS: no fevers or chills, productive cough, hemoptysis, dysphasia, odynophagia, melena, hematochezia, dysuria, hematuria, rash, seizure activity, orthopnea, PND, pedal edema, claudication. Remaining systems are negative.  Physical Exam: Well-developed well-nourished in no acute distress.  Skin is warm and dry.  HEENT is normal.  Neck is supple.  Chest is clear to auscultation with normal expansion.  Cardiovascular exam is regular rate and rhythm.  Abdominal exam nontender or distended. No masses palpated. Extremities show 1+ edema. neuro grossly intact  ECG sinus rhythm at a rate of  63. First degree AV block. Axis normal. Probable precordial lead reversal.

## 2011-08-29 NOTE — Assessment & Plan Note (Signed)
Continue ACE inhibitor and beta blocker. Plan repeat echocardiogram when she returns in one year.

## 2011-08-29 NOTE — Patient Instructions (Addendum)
Your physician wants you to follow-up in: ONE YEAR WITH DR Jens Som [place] You will receive a reminder letter in the mail two months in advance. If you don't receive a letter, please call our office to schedule the follow-up appointment.   INCREASE LISINOPRIL TO 40 MG ONCE DAILY  Your physician recommends that you return for lab work in:ONE WEEK IN [employer]

## 2011-09-28 ENCOUNTER — Other Ambulatory Visit: Payer: Self-pay | Admitting: Cardiology

## 2011-09-28 DIAGNOSIS — I10 Essential (primary) hypertension: Secondary | ICD-10-CM | POA: Diagnosis not present

## 2011-09-29 LAB — BASIC METABOLIC PANEL
BUN: 21 mg/dL (ref 6–23)
Chloride: 105 mEq/L (ref 96–112)
Creat: 0.71 mg/dL (ref 0.50–1.10)

## 2011-10-12 DIAGNOSIS — I428 Other cardiomyopathies: Secondary | ICD-10-CM | POA: Diagnosis not present

## 2011-10-12 DIAGNOSIS — E7800 Pure hypercholesterolemia, unspecified: Secondary | ICD-10-CM | POA: Diagnosis not present

## 2011-10-12 DIAGNOSIS — I10 Essential (primary) hypertension: Secondary | ICD-10-CM | POA: Diagnosis not present

## 2011-10-12 DIAGNOSIS — Z23 Encounter for immunization: Secondary | ICD-10-CM | POA: Diagnosis not present

## 2011-11-02 DIAGNOSIS — I10 Essential (primary) hypertension: Secondary | ICD-10-CM | POA: Diagnosis not present

## 2011-11-02 DIAGNOSIS — E7800 Pure hypercholesterolemia, unspecified: Secondary | ICD-10-CM | POA: Diagnosis not present

## 2011-11-23 ENCOUNTER — Other Ambulatory Visit: Payer: Self-pay | Admitting: Cardiology

## 2011-12-13 DIAGNOSIS — I10 Essential (primary) hypertension: Secondary | ICD-10-CM | POA: Diagnosis not present

## 2012-04-17 DIAGNOSIS — R040 Epistaxis: Secondary | ICD-10-CM | POA: Diagnosis not present

## 2012-04-18 ENCOUNTER — Other Ambulatory Visit: Payer: Self-pay | Admitting: Cardiology

## 2012-06-11 DIAGNOSIS — I10 Essential (primary) hypertension: Secondary | ICD-10-CM | POA: Diagnosis not present

## 2012-06-11 DIAGNOSIS — E7800 Pure hypercholesterolemia, unspecified: Secondary | ICD-10-CM | POA: Diagnosis not present

## 2012-06-11 DIAGNOSIS — Z1331 Encounter for screening for depression: Secondary | ICD-10-CM | POA: Diagnosis not present

## 2012-06-11 DIAGNOSIS — N318 Other neuromuscular dysfunction of bladder: Secondary | ICD-10-CM | POA: Diagnosis not present

## 2012-06-24 ENCOUNTER — Telehealth: Payer: Self-pay | Admitting: Cardiology

## 2012-06-24 MED ORDER — FUROSEMIDE 20 MG PO TABS: 20.0000 mg | ORAL_TABLET | Freq: Every day | ORAL | Status: DC

## 2012-06-24 NOTE — Telephone Encounter (Signed)
New problem   [facility] 20[facility]  in order for ins to pay. [facility]

## 2012-06-24 NOTE — Telephone Encounter (Signed)
Script resent to pharm with # [phone].

## 2012-09-10 DIAGNOSIS — L659 Nonscarring hair loss, unspecified: Secondary | ICD-10-CM | POA: Diagnosis not present

## 2012-09-10 DIAGNOSIS — I10 Essential (primary) hypertension: Secondary | ICD-10-CM | POA: Diagnosis not present

## 2012-11-29 DIAGNOSIS — Z23 Encounter for immunization: Secondary | ICD-10-CM | POA: Diagnosis not present

## 2013-06-10 DIAGNOSIS — N318 Other neuromuscular dysfunction of bladder: Secondary | ICD-10-CM | POA: Diagnosis not present

## 2013-06-10 DIAGNOSIS — I10 Essential (primary) hypertension: Secondary | ICD-10-CM | POA: Diagnosis not present

## 2013-06-10 DIAGNOSIS — E7800 Pure hypercholesterolemia, unspecified: Secondary | ICD-10-CM | POA: Diagnosis not present

## 2013-06-10 DIAGNOSIS — Z23 Encounter for immunization: Secondary | ICD-10-CM | POA: Diagnosis not present

## 2013-07-18 DIAGNOSIS — N183 Chronic kidney disease, stage 3 unspecified: Secondary | ICD-10-CM | POA: Diagnosis not present

## 2013-10-20 DIAGNOSIS — N183 Chronic kidney disease, stage 3 unspecified: Secondary | ICD-10-CM | POA: Diagnosis not present

## 2013-10-20 DIAGNOSIS — I428 Other cardiomyopathies: Secondary | ICD-10-CM | POA: Diagnosis not present

## 2013-10-20 DIAGNOSIS — Z23 Encounter for immunization: Secondary | ICD-10-CM | POA: Diagnosis not present

## 2013-12-11 DIAGNOSIS — I129 Hypertensive chronic kidney disease with stage 1 through stage 4 chronic kidney disease, or unspecified chronic kidney disease: Secondary | ICD-10-CM | POA: Diagnosis not present

## 2013-12-11 DIAGNOSIS — I428 Other cardiomyopathies: Secondary | ICD-10-CM | POA: Diagnosis not present

## 2013-12-11 DIAGNOSIS — N3281 Overactive bladder: Secondary | ICD-10-CM | POA: Diagnosis not present

## 2013-12-11 DIAGNOSIS — N183 Chronic kidney disease, stage 3 (moderate): Secondary | ICD-10-CM | POA: Diagnosis not present

## 2013-12-11 DIAGNOSIS — E782 Mixed hyperlipidemia: Secondary | ICD-10-CM | POA: Diagnosis not present

## 2014-06-09 DIAGNOSIS — E782 Mixed hyperlipidemia: Secondary | ICD-10-CM | POA: Diagnosis not present

## 2014-06-09 DIAGNOSIS — N183 Chronic kidney disease, stage 3 (moderate): Secondary | ICD-10-CM | POA: Diagnosis not present

## 2014-06-09 DIAGNOSIS — I129 Hypertensive chronic kidney disease with stage 1 through stage 4 chronic kidney disease, or unspecified chronic kidney disease: Secondary | ICD-10-CM | POA: Diagnosis not present

## 2014-12-10 DIAGNOSIS — E782 Mixed hyperlipidemia: Secondary | ICD-10-CM | POA: Diagnosis not present

## 2014-12-10 DIAGNOSIS — R269 Unspecified abnormalities of gait and mobility: Secondary | ICD-10-CM | POA: Diagnosis not present

## 2014-12-10 DIAGNOSIS — N183 Chronic kidney disease, stage 3 (moderate): Secondary | ICD-10-CM | POA: Diagnosis not present

## 2014-12-10 DIAGNOSIS — I129 Hypertensive chronic kidney disease with stage 1 through stage 4 chronic kidney disease, or unspecified chronic kidney disease: Secondary | ICD-10-CM | POA: Diagnosis not present

## 2014-12-10 DIAGNOSIS — Z23 Encounter for immunization: Secondary | ICD-10-CM | POA: Diagnosis not present

## 2015-06-09 DIAGNOSIS — N3281 Overactive bladder: Secondary | ICD-10-CM | POA: Diagnosis not present

## 2015-06-09 DIAGNOSIS — E782 Mixed hyperlipidemia: Secondary | ICD-10-CM | POA: Diagnosis not present

## 2015-06-09 DIAGNOSIS — I129 Hypertensive chronic kidney disease with stage 1 through stage 4 chronic kidney disease, or unspecified chronic kidney disease: Secondary | ICD-10-CM | POA: Diagnosis not present

## 2015-06-09 DIAGNOSIS — N183 Chronic kidney disease, stage 3 (moderate): Secondary | ICD-10-CM | POA: Diagnosis not present

## 2015-12-08 DIAGNOSIS — I129 Hypertensive chronic kidney disease with stage 1 through stage 4 chronic kidney disease, or unspecified chronic kidney disease: Secondary | ICD-10-CM | POA: Diagnosis not present

## 2015-12-08 DIAGNOSIS — N3281 Overactive bladder: Secondary | ICD-10-CM | POA: Diagnosis not present

## 2015-12-08 DIAGNOSIS — Z23 Encounter for immunization: Secondary | ICD-10-CM | POA: Diagnosis not present

## 2015-12-08 DIAGNOSIS — R269 Unspecified abnormalities of gait and mobility: Secondary | ICD-10-CM | POA: Diagnosis not present

## 2015-12-08 DIAGNOSIS — E782 Mixed hyperlipidemia: Secondary | ICD-10-CM | POA: Diagnosis not present

## 2015-12-08 DIAGNOSIS — N183 Chronic kidney disease, stage 3 (moderate): Secondary | ICD-10-CM | POA: Diagnosis not present

## 2016-06-08 DIAGNOSIS — D649 Anemia, unspecified: Secondary | ICD-10-CM | POA: Diagnosis not present

## 2016-06-08 DIAGNOSIS — Z0000 Encounter for general adult medical examination without abnormal findings: Secondary | ICD-10-CM | POA: Diagnosis not present

## 2016-06-08 DIAGNOSIS — B351 Tinea unguium: Secondary | ICD-10-CM | POA: Diagnosis not present

## 2016-06-08 DIAGNOSIS — E782 Mixed hyperlipidemia: Secondary | ICD-10-CM | POA: Diagnosis not present

## 2016-06-08 DIAGNOSIS — I129 Hypertensive chronic kidney disease with stage 1 through stage 4 chronic kidney disease, or unspecified chronic kidney disease: Secondary | ICD-10-CM | POA: Diagnosis not present

## 2016-06-08 DIAGNOSIS — M810 Age-related osteoporosis without current pathological fracture: Secondary | ICD-10-CM | POA: Diagnosis not present

## 2016-06-08 DIAGNOSIS — N3281 Overactive bladder: Secondary | ICD-10-CM | POA: Diagnosis not present

## 2016-06-08 DIAGNOSIS — N183 Chronic kidney disease, stage 3 (moderate): Secondary | ICD-10-CM | POA: Diagnosis not present

## 2016-06-08 DIAGNOSIS — I428 Other cardiomyopathies: Secondary | ICD-10-CM | POA: Diagnosis not present

## 2016-12-05 DIAGNOSIS — I129 Hypertensive chronic kidney disease with stage 1 through stage 4 chronic kidney disease, or unspecified chronic kidney disease: Secondary | ICD-10-CM | POA: Diagnosis not present

## 2016-12-05 DIAGNOSIS — B351 Tinea unguium: Secondary | ICD-10-CM | POA: Diagnosis not present

## 2016-12-05 DIAGNOSIS — M810 Age-related osteoporosis without current pathological fracture: Secondary | ICD-10-CM | POA: Diagnosis not present

## 2016-12-05 DIAGNOSIS — E782 Mixed hyperlipidemia: Secondary | ICD-10-CM | POA: Diagnosis not present

## 2016-12-05 DIAGNOSIS — N183 Chronic kidney disease, stage 3 (moderate): Secondary | ICD-10-CM | POA: Diagnosis not present

## 2016-12-05 DIAGNOSIS — Z23 Encounter for immunization: Secondary | ICD-10-CM | POA: Diagnosis not present

## 2017-05-14 DIAGNOSIS — B351 Tinea unguium: Secondary | ICD-10-CM | POA: Diagnosis not present

## 2017-05-14 DIAGNOSIS — M79674 Pain in right toe(s): Secondary | ICD-10-CM | POA: Diagnosis not present

## 2017-05-14 DIAGNOSIS — I739 Peripheral vascular disease, unspecified: Secondary | ICD-10-CM | POA: Diagnosis not present

## 2017-05-14 DIAGNOSIS — L603 Nail dystrophy: Secondary | ICD-10-CM | POA: Diagnosis not present

## 2017-05-14 DIAGNOSIS — M79675 Pain in left toe(s): Secondary | ICD-10-CM | POA: Diagnosis not present

## 2017-05-26 DIAGNOSIS — I10 Essential (primary) hypertension: Secondary | ICD-10-CM | POA: Diagnosis not present

## 2017-05-26 DIAGNOSIS — K219 Gastro-esophageal reflux disease without esophagitis: Secondary | ICD-10-CM | POA: Diagnosis not present

## 2017-05-26 DIAGNOSIS — Z79899 Other long term (current) drug therapy: Secondary | ICD-10-CM | POA: Diagnosis not present

## 2017-05-26 DIAGNOSIS — I209 Angina pectoris, unspecified: Secondary | ICD-10-CM | POA: Diagnosis not present

## 2017-05-26 DIAGNOSIS — I25118 Atherosclerotic heart disease of native coronary artery with other forms of angina pectoris: Secondary | ICD-10-CM | POA: Diagnosis not present

## 2017-06-19 DIAGNOSIS — N3281 Overactive bladder: Secondary | ICD-10-CM | POA: Diagnosis not present

## 2017-06-19 DIAGNOSIS — E782 Mixed hyperlipidemia: Secondary | ICD-10-CM | POA: Diagnosis not present

## 2017-06-19 DIAGNOSIS — K648 Other hemorrhoids: Secondary | ICD-10-CM | POA: Diagnosis not present

## 2017-06-19 DIAGNOSIS — Z0000 Encounter for general adult medical examination without abnormal findings: Secondary | ICD-10-CM | POA: Diagnosis not present

## 2017-06-19 DIAGNOSIS — N183 Chronic kidney disease, stage 3 (moderate): Secondary | ICD-10-CM | POA: Diagnosis not present

## 2017-06-19 DIAGNOSIS — M810 Age-related osteoporosis without current pathological fracture: Secondary | ICD-10-CM | POA: Diagnosis not present

## 2017-06-19 DIAGNOSIS — I129 Hypertensive chronic kidney disease with stage 1 through stage 4 chronic kidney disease, or unspecified chronic kidney disease: Secondary | ICD-10-CM | POA: Diagnosis not present

## 2017-07-19 DIAGNOSIS — K648 Other hemorrhoids: Secondary | ICD-10-CM | POA: Diagnosis not present

## 2017-07-19 DIAGNOSIS — D649 Anemia, unspecified: Secondary | ICD-10-CM | POA: Diagnosis not present

## 2017-10-09 DIAGNOSIS — R350 Frequency of micturition: Secondary | ICD-10-CM | POA: Diagnosis not present

## 2017-10-09 DIAGNOSIS — R269 Unspecified abnormalities of gait and mobility: Secondary | ICD-10-CM | POA: Diagnosis not present

## 2017-10-09 DIAGNOSIS — R319 Hematuria, unspecified: Secondary | ICD-10-CM | POA: Diagnosis not present

## 2017-10-09 DIAGNOSIS — R413 Other amnesia: Secondary | ICD-10-CM | POA: Diagnosis not present

## 2017-10-09 DIAGNOSIS — R296 Repeated falls: Secondary | ICD-10-CM | POA: Diagnosis not present

## 2017-10-09 DIAGNOSIS — I129 Hypertensive chronic kidney disease with stage 1 through stage 4 chronic kidney disease, or unspecified chronic kidney disease: Secondary | ICD-10-CM | POA: Diagnosis not present

## 2017-10-09 DIAGNOSIS — D638 Anemia in other chronic diseases classified elsewhere: Secondary | ICD-10-CM | POA: Diagnosis not present

## 2017-10-09 DIAGNOSIS — D649 Anemia, unspecified: Secondary | ICD-10-CM | POA: Diagnosis not present

## 2017-10-09 DIAGNOSIS — N390 Urinary tract infection, site not specified: Secondary | ICD-10-CM | POA: Diagnosis not present

## 2017-11-05 DIAGNOSIS — H353132 Nonexudative age-related macular degeneration, bilateral, intermediate dry stage: Secondary | ICD-10-CM | POA: Diagnosis not present

## 2017-11-05 DIAGNOSIS — H40033 Anatomical narrow angle, bilateral: Secondary | ICD-10-CM | POA: Diagnosis not present

## 2017-11-05 DIAGNOSIS — H25813 Combined forms of age-related cataract, bilateral: Secondary | ICD-10-CM | POA: Diagnosis not present

## 2017-11-21 DIAGNOSIS — I519 Heart disease, unspecified: Secondary | ICD-10-CM | POA: Diagnosis not present

## 2017-11-21 DIAGNOSIS — E782 Mixed hyperlipidemia: Secondary | ICD-10-CM | POA: Diagnosis not present

## 2017-11-21 DIAGNOSIS — I440 Atrioventricular block, first degree: Secondary | ICD-10-CM | POA: Diagnosis not present

## 2017-11-21 DIAGNOSIS — Z23 Encounter for immunization: Secondary | ICD-10-CM | POA: Diagnosis not present

## 2017-11-21 DIAGNOSIS — Z01818 Encounter for other preprocedural examination: Secondary | ICD-10-CM | POA: Diagnosis not present

## 2017-11-21 DIAGNOSIS — N183 Chronic kidney disease, stage 3 (moderate): Secondary | ICD-10-CM | POA: Diagnosis not present

## 2017-11-26 DIAGNOSIS — H2513 Age-related nuclear cataract, bilateral: Secondary | ICD-10-CM | POA: Diagnosis not present

## 2017-11-26 DIAGNOSIS — H2511 Age-related nuclear cataract, right eye: Secondary | ICD-10-CM | POA: Diagnosis not present

## 2017-12-25 DIAGNOSIS — H25811 Combined forms of age-related cataract, right eye: Secondary | ICD-10-CM | POA: Diagnosis not present

## 2017-12-25 DIAGNOSIS — H268 Other specified cataract: Secondary | ICD-10-CM | POA: Diagnosis not present

## 2017-12-25 DIAGNOSIS — H2511 Age-related nuclear cataract, right eye: Secondary | ICD-10-CM | POA: Diagnosis not present

## 2017-12-25 DIAGNOSIS — H40033 Anatomical narrow angle, bilateral: Secondary | ICD-10-CM | POA: Diagnosis not present

## 2018-01-02 DIAGNOSIS — H2512 Age-related nuclear cataract, left eye: Secondary | ICD-10-CM | POA: Diagnosis not present

## 2018-01-29 DIAGNOSIS — H2189 Other specified disorders of iris and ciliary body: Secondary | ICD-10-CM | POA: Diagnosis not present

## 2018-01-29 DIAGNOSIS — H268 Other specified cataract: Secondary | ICD-10-CM | POA: Diagnosis not present

## 2018-01-29 DIAGNOSIS — H25812 Combined forms of age-related cataract, left eye: Secondary | ICD-10-CM | POA: Diagnosis not present

## 2018-01-29 DIAGNOSIS — H2512 Age-related nuclear cataract, left eye: Secondary | ICD-10-CM | POA: Diagnosis not present

## 2018-10-23 DIAGNOSIS — Z23 Encounter for immunization: Secondary | ICD-10-CM | POA: Diagnosis not present

## 2018-10-23 DIAGNOSIS — Z0000 Encounter for general adult medical examination without abnormal findings: Secondary | ICD-10-CM | POA: Diagnosis not present

## 2018-10-23 DIAGNOSIS — R413 Other amnesia: Secondary | ICD-10-CM | POA: Diagnosis not present

## 2018-10-23 DIAGNOSIS — N183 Chronic kidney disease, stage 3 (moderate): Secondary | ICD-10-CM | POA: Diagnosis not present

## 2018-10-23 DIAGNOSIS — E782 Mixed hyperlipidemia: Secondary | ICD-10-CM | POA: Diagnosis not present

## 2018-10-23 DIAGNOSIS — I519 Heart disease, unspecified: Secondary | ICD-10-CM | POA: Diagnosis not present

## 2018-10-23 DIAGNOSIS — I129 Hypertensive chronic kidney disease with stage 1 through stage 4 chronic kidney disease, or unspecified chronic kidney disease: Secondary | ICD-10-CM | POA: Diagnosis not present

## 2018-11-20 DIAGNOSIS — N1832 Chronic kidney disease, stage 3b: Secondary | ICD-10-CM | POA: Diagnosis not present

## 2018-11-20 DIAGNOSIS — E782 Mixed hyperlipidemia: Secondary | ICD-10-CM | POA: Diagnosis not present

## 2018-11-20 DIAGNOSIS — Z09 Encounter for follow-up examination after completed treatment for conditions other than malignant neoplasm: Secondary | ICD-10-CM | POA: Diagnosis not present

## 2018-11-20 DIAGNOSIS — I129 Hypertensive chronic kidney disease with stage 1 through stage 4 chronic kidney disease, or unspecified chronic kidney disease: Secondary | ICD-10-CM | POA: Diagnosis not present

## 2018-12-31 ENCOUNTER — Encounter (HOSPITAL_COMMUNITY): Payer: Self-pay | Admitting: Emergency Medicine

## 2018-12-31 ENCOUNTER — Emergency Department (HOSPITAL_COMMUNITY): Payer: Medicare Other

## 2018-12-31 ENCOUNTER — Inpatient Hospital Stay (HOSPITAL_COMMUNITY)
Admission: EM | Admit: 2018-12-31 | Discharge: 2019-01-08 | DRG: 871 | Disposition: A | Discharge status: Skilled Nursing Facility | Payer: Medicare Other | Attending: Internal Medicine | Admitting: Internal Medicine

## 2018-12-31 ENCOUNTER — Other Ambulatory Visit: Payer: Self-pay

## 2018-12-31 DIAGNOSIS — Z6824 Body mass index (BMI) 24.0-24.9, adult: Secondary | ICD-10-CM

## 2018-12-31 DIAGNOSIS — M879 Osteonecrosis, unspecified: Secondary | ICD-10-CM | POA: Diagnosis present

## 2018-12-31 DIAGNOSIS — G9341 Metabolic encephalopathy: Secondary | ICD-10-CM | POA: Diagnosis present

## 2018-12-31 DIAGNOSIS — I700 Atherosclerosis of aorta: Secondary | ICD-10-CM | POA: Diagnosis present

## 2018-12-31 DIAGNOSIS — K449 Diaphragmatic hernia without obstruction or gangrene: Secondary | ICD-10-CM | POA: Diagnosis present

## 2018-12-31 DIAGNOSIS — T7401XA Adult neglect or abandonment, confirmed, initial encounter: Secondary | ICD-10-CM | POA: Diagnosis present

## 2018-12-31 DIAGNOSIS — N179 Acute kidney failure, unspecified: Secondary | ICD-10-CM | POA: Diagnosis not present

## 2018-12-31 DIAGNOSIS — L89616 Pressure-induced deep tissue damage of right heel: Secondary | ICD-10-CM | POA: Diagnosis present

## 2018-12-31 DIAGNOSIS — E860 Dehydration: Secondary | ICD-10-CM | POA: Diagnosis present

## 2018-12-31 DIAGNOSIS — E870 Hyperosmolality and hypernatremia: Secondary | ICD-10-CM | POA: Diagnosis present

## 2018-12-31 DIAGNOSIS — I110 Hypertensive heart disease with heart failure: Secondary | ICD-10-CM | POA: Diagnosis present

## 2018-12-31 DIAGNOSIS — R410 Disorientation, unspecified: Secondary | ICD-10-CM | POA: Diagnosis present

## 2018-12-31 DIAGNOSIS — L89102 Pressure ulcer of unspecified part of back, stage 2: Secondary | ICD-10-CM | POA: Diagnosis present

## 2018-12-31 DIAGNOSIS — E871 Hypo-osmolality and hyponatremia: Secondary | ICD-10-CM | POA: Diagnosis present

## 2018-12-31 DIAGNOSIS — J9811 Atelectasis: Secondary | ICD-10-CM | POA: Diagnosis present

## 2018-12-31 DIAGNOSIS — I5181 Takotsubo syndrome: Secondary | ICD-10-CM | POA: Diagnosis present

## 2018-12-31 DIAGNOSIS — Z7189 Other specified counseling: Secondary | ICD-10-CM

## 2018-12-31 DIAGNOSIS — L89150 Pressure ulcer of sacral region, unstageable: Secondary | ICD-10-CM | POA: Diagnosis present

## 2018-12-31 DIAGNOSIS — L89896 Pressure-induced deep tissue damage of other site: Secondary | ICD-10-CM | POA: Diagnosis present

## 2018-12-31 DIAGNOSIS — E785 Hyperlipidemia, unspecified: Secondary | ICD-10-CM | POA: Diagnosis present

## 2018-12-31 DIAGNOSIS — E440 Moderate protein-calorie malnutrition: Secondary | ICD-10-CM | POA: Diagnosis not present

## 2018-12-31 DIAGNOSIS — R4182 Altered mental status, unspecified: Secondary | ICD-10-CM

## 2018-12-31 DIAGNOSIS — K219 Gastro-esophageal reflux disease without esophagitis: Secondary | ICD-10-CM | POA: Diagnosis present

## 2018-12-31 DIAGNOSIS — Z882 Allergy status to sulfonamides status: Secondary | ICD-10-CM

## 2018-12-31 DIAGNOSIS — T796XXA Traumatic ischemia of muscle, initial encounter: Secondary | ICD-10-CM | POA: Diagnosis present

## 2018-12-31 DIAGNOSIS — R627 Adult failure to thrive: Secondary | ICD-10-CM | POA: Diagnosis present

## 2018-12-31 DIAGNOSIS — E43 Unspecified severe protein-calorie malnutrition: Secondary | ICD-10-CM | POA: Diagnosis present

## 2018-12-31 DIAGNOSIS — L8990 Pressure ulcer of unspecified site, unspecified stage: Secondary | ICD-10-CM | POA: Diagnosis not present

## 2018-12-31 DIAGNOSIS — Z91040 Latex allergy status: Secondary | ICD-10-CM

## 2018-12-31 DIAGNOSIS — W19XXXA Unspecified fall, initial encounter: Secondary | ICD-10-CM | POA: Diagnosis present

## 2018-12-31 DIAGNOSIS — Z880 Allergy status to penicillin: Secondary | ICD-10-CM

## 2018-12-31 DIAGNOSIS — R1310 Dysphagia, unspecified: Secondary | ICD-10-CM | POA: Diagnosis present

## 2018-12-31 DIAGNOSIS — Z20828 Contact with and (suspected) exposure to other viral communicable diseases: Secondary | ICD-10-CM | POA: Diagnosis present

## 2018-12-31 DIAGNOSIS — A4159 Other Gram-negative sepsis: Principal | ICD-10-CM | POA: Diagnosis present

## 2018-12-31 DIAGNOSIS — E876 Hypokalemia: Secondary | ICD-10-CM | POA: Diagnosis present

## 2018-12-31 DIAGNOSIS — M79604 Pain in right leg: Secondary | ICD-10-CM

## 2018-12-31 DIAGNOSIS — R634 Abnormal weight loss: Secondary | ICD-10-CM | POA: Diagnosis present

## 2018-12-31 DIAGNOSIS — B964 Proteus (mirabilis) (morganii) as the cause of diseases classified elsewhere: Secondary | ICD-10-CM | POA: Diagnosis present

## 2018-12-31 DIAGNOSIS — I5022 Chronic systolic (congestive) heart failure: Secondary | ICD-10-CM | POA: Diagnosis present

## 2018-12-31 DIAGNOSIS — N39490 Overflow incontinence: Secondary | ICD-10-CM | POA: Diagnosis present

## 2018-12-31 DIAGNOSIS — E872 Acidosis: Secondary | ICD-10-CM | POA: Diagnosis present

## 2018-12-31 DIAGNOSIS — Z515 Encounter for palliative care: Secondary | ICD-10-CM | POA: Diagnosis not present

## 2018-12-31 DIAGNOSIS — Z90710 Acquired absence of both cervix and uterus: Secondary | ICD-10-CM

## 2018-12-31 DIAGNOSIS — R54 Age-related physical debility: Secondary | ICD-10-CM | POA: Diagnosis present

## 2018-12-31 DIAGNOSIS — D638 Anemia in other chronic diseases classified elsewhere: Secondary | ICD-10-CM | POA: Diagnosis present

## 2018-12-31 DIAGNOSIS — A498 Other bacterial infections of unspecified site: Secondary | ICD-10-CM | POA: Diagnosis not present

## 2018-12-31 DIAGNOSIS — Z79899 Other long term (current) drug therapy: Secondary | ICD-10-CM

## 2018-12-31 DIAGNOSIS — Z7982 Long term (current) use of aspirin: Secondary | ICD-10-CM

## 2018-12-31 HISTORY — DX: Disorder of kidney and ureter, unspecified: N28.9

## 2018-12-31 LAB — COMPREHENSIVE METABOLIC PANEL
ALT: 36 U/L (ref 0–44)
AST: 60 U/L — ABNORMAL HIGH (ref 15–41)
Albumin: 3.5 g/dL (ref 3.5–5.0)
Alkaline Phosphatase: 76 U/L (ref 38–126)
Anion gap: 21 — ABNORMAL HIGH (ref 5–15)
BUN: 58 mg/dL — ABNORMAL HIGH (ref 8–23)
CO2: 20 mmol/L — ABNORMAL LOW (ref 22–32)
Calcium: 10 mg/dL (ref 8.9–10.3)
Chloride: 112 mmol/L — ABNORMAL HIGH (ref 98–111)
Creatinine, Ser: 1.36 mg/dL — ABNORMAL HIGH (ref 0.44–1.00)
GFR calc Af Amer: 40 mL/min — ABNORMAL LOW (ref >60)
GFR calc non Af Amer: 35 mL/min — ABNORMAL LOW (ref >60)
Glucose, Bld: 114 mg/dL — ABNORMAL HIGH (ref 70–99)
Potassium: 3.8 mmol/L (ref 3.5–5.1)
Sodium: 153 mmol/L — ABNORMAL HIGH (ref 135–145)
Total Bilirubin: 1.3 mg/dL — ABNORMAL HIGH (ref 0.3–1.2)
Total Protein: 8.1 g/dL (ref 6.5–8.1)

## 2018-12-31 LAB — CBC WITH DIFFERENTIAL/PLATELET
Abs Immature Granulocytes: 0.09 10*3/uL — ABNORMAL HIGH (ref 0.00–0.07)
Basophils Absolute: 0 10*3/uL (ref 0.0–0.1)
Basophils Relative: 0 %
Eosinophils Absolute: 0 10*3/uL (ref 0.0–0.5)
Eosinophils Relative: 0 %
HCT: 43.9 % (ref 36.0–46.0)
Hemoglobin: 13.9 g/dL (ref 12.0–15.0)
Immature Granulocytes: 1 %
Lymphocytes Relative: 4 %
Lymphs Abs: 0.6 10*3/uL — ABNORMAL LOW (ref 0.7–4.0)
MCH: 30.3 pg (ref 26.0–34.0)
MCHC: 31.7 g/dL (ref 30.0–36.0)
MCV: 95.6 fL (ref 80.0–100.0)
Monocytes Absolute: 0.9 10*3/uL (ref 0.1–1.0)
Monocytes Relative: 6 %
Neutro Abs: 13.5 10*3/uL — ABNORMAL HIGH (ref 1.7–7.7)
Neutrophils Relative %: 89 %
Platelets: 350 10*3/uL (ref 150–400)
RBC: 4.59 MIL/uL (ref 3.87–5.11)
RDW: 14.4 % (ref 11.5–15.5)
WBC: 15.1 10*3/uL — ABNORMAL HIGH (ref 4.0–10.5)
nRBC: 0 % (ref 0.0–0.2)

## 2018-12-31 LAB — LIPASE, BLOOD: Lipase: 30 U/L (ref 11–51)

## 2018-12-31 LAB — URINALYSIS, ROUTINE W REFLEX MICROSCOPIC
Bilirubin Urine: NEGATIVE
Glucose, UA: NEGATIVE mg/dL
Ketones, ur: 20 mg/dL — AB
Leukocytes,Ua: NEGATIVE
Nitrite: NEGATIVE
Protein, ur: NEGATIVE mg/dL
Specific Gravity, Urine: 1.016 (ref 1.005–1.030)
pH: 5 (ref 5.0–8.0)

## 2018-12-31 LAB — CBC
HCT: 36.8 % (ref 36.0–46.0)
Hemoglobin: 11.6 g/dL — ABNORMAL LOW (ref 12.0–15.0)
MCH: 30.2 pg (ref 26.0–34.0)
MCHC: 31.5 g/dL (ref 30.0–36.0)
MCV: 95.8 fL (ref 80.0–100.0)
Platelets: 270 10*3/uL (ref 150–400)
RBC: 3.84 MIL/uL — ABNORMAL LOW (ref 3.87–5.11)
RDW: 14.4 % (ref 11.5–15.5)
WBC: 12.1 10*3/uL — ABNORMAL HIGH (ref 4.0–10.5)
nRBC: 0 % (ref 0.0–0.2)

## 2018-12-31 LAB — CREATININE, SERUM
Creatinine, Ser: 1.25 mg/dL — ABNORMAL HIGH (ref 0.44–1.00)
GFR calc Af Amer: 44 mL/min — ABNORMAL LOW (ref >60)
GFR calc non Af Amer: 38 mL/min — ABNORMAL LOW (ref >60)

## 2018-12-31 LAB — LACTIC ACID, PLASMA
Lactic Acid, Venous: 2.2 mmol/L (ref 0.5–1.9)
Lactic Acid, Venous: 2 mmol/L (ref 0.5–1.9)

## 2018-12-31 LAB — TSH: TSH: 0.696 u[IU]/mL (ref 0.350–4.500)

## 2018-12-31 LAB — TROPONIN I (HIGH SENSITIVITY)
Troponin I (High Sensitivity): 42 ng/L — ABNORMAL HIGH (ref <18)
Troponin I (High Sensitivity): 47 ng/L — ABNORMAL HIGH (ref <18)

## 2018-12-31 LAB — SARS CORONAVIRUS 2 (TAT 6-24 HRS): SARS Coronavirus 2: NEGATIVE

## 2018-12-31 LAB — POC SARS CORONAVIRUS 2 AG -  ED: SARS Coronavirus 2 Ag: NEGATIVE

## 2018-12-31 LAB — CK: Total CK: 655 U/L — ABNORMAL HIGH (ref 38–234)

## 2018-12-31 LAB — MAGNESIUM: Magnesium: 2.2 mg/dL (ref 1.7–2.4)

## 2018-12-31 MED ORDER — DEXTROSE-NACL 5-0.45 % IV SOLN
INTRAVENOUS | Status: DC
  Administered 2018-12-31: 100 mL/h via INTRAVENOUS
  Administered 2019-01-01: 06:00:00 via INTRAVENOUS

## 2018-12-31 MED ORDER — ENOXAPARIN SODIUM 30 MG/0.3ML ~~LOC~~ SOLN
30.0000 mg | SUBCUTANEOUS | Status: DC
  Administered 2018-12-31 – 2019-01-07 (×8): 30 mg via SUBCUTANEOUS
  Filled 2018-12-31 (×8): qty 0.3

## 2018-12-31 MED ORDER — SODIUM CHLORIDE 0.9% FLUSH
3.0000 mL | Freq: Two times a day (BID) | INTRAVENOUS | Status: DC
  Administered 2019-01-01 – 2019-01-07 (×8): 3 mL via INTRAVENOUS

## 2018-12-31 MED ORDER — SODIUM CHLORIDE 0.9 % IV BOLUS: 1000.0000 mL | Freq: Once | INTRAVENOUS | Status: DC

## 2018-12-31 MED ORDER — ASPIRIN 325 MG PO TABS
325.0000 mg | ORAL_TABLET | Freq: Every day | ORAL | Status: DC
  Administered 2018-12-31: 325 mg via ORAL
  Filled 2018-12-31: qty 1

## 2018-12-31 MED ORDER — SODIUM CHLORIDE 0.9 % IV BOLUS
1000.0000 mL | Freq: Once | INTRAVENOUS | Status: AC
  Administered 2018-12-31: 1000 mL via INTRAVENOUS

## 2018-12-31 NOTE — ED Provider Notes (Signed)
[facility] Provider Note   CSN: [id number] Arrival date & time: 12/31/18  1328     History   Chief Complaint No chief complaint on file.   HPI Tracey HarriesMarian A Camacho is a 85 y.o. female.     The history is provided by the EMS personnel, the patient, medical records and a relative. No language interpreter was used.  Fall This is a new problem. The current episode started more than 2 days ago. The problem occurs constantly. The problem has not changed since onset.Pertinent negatives include no chest pain, no abdominal pain, no headaches and no shortness of breath. Nothing aggravates the symptoms. Nothing relieves the symptoms. She has tried nothing for the symptoms. The treatment provided no relief.    Past Medical History:  Diagnosis Date  . Arthritis   . Cardiomyopathy    tako tsubo  . Dyslipidemia   . Epistaxis   . GERD (gastroesophageal reflux disease)   . HTN (hypertension)     Patient Active Problem List   Diagnosis Date Noted  . CARDIOMYOPATHY 09/20/2009  . DYSLIPIDEMIA 09/15/2009  . HYPERTENSION 09/15/2009  . GERD 09/15/2009  . ARTHRITIS 09/15/2009  . EPISTAXIS 09/15/2009  . CHEST PAIN 09/15/2009    Past Surgical History:  Procedure Laterality Date  . KNEE SURGERY  90's  . TOE SURGERY    . TOTAL ABDOMINAL HYSTERECTOMY W/ BILATERAL SALPINGOOPHORECTOMY       OB History   No obstetric history on file.      Home Medications    Prior to Admission medications   Medication Sig Start Date End Date Taking? Authorizing Provider  aspirin 325 MG tablet Take 325 mg by mouth daily.      [provider]  Calcium 45 MG CAPS daily     [provider]  Cholecalciferol (VITAMIN D) 400 UNITS capsule Take 400 Units by mouth 2 (two) times daily.      [provider]  furosemide (LASIX) 20 MG tablet Take 1 tablet (20 mg total) by mouth daily. 06/24/12   Lewayne Buntingrenshaw, Brian S, MD  lisinopril (PRINIVIL,ZESTRIL) 40 MG  tablet Take 1 tablet (40 mg total) by mouth daily. 08/29/11   Lewayne Buntingrenshaw, Brian S, MD  metoprolol succinate (TOPROL-XL) 25 MG 24 hr tablet Take 50 mg by mouth daily. 10/05/10   Lewayne Buntingrenshaw, Brian S, MD  Multiple Vitamins-Minerals (CENTRUM PO) 1 tab po qd     [provider]  oxybutynin (DITROPAN) 5 MG tablet Take 5 mg by mouth 3 (three) times daily.      [provider]  simvastatin (ZOCOR) 20 MG tablet Take 20 mg by mouth at bedtime.      [provider]    Family History Family History  Problem Relation Age of Onset  . Cancer Mother        colon    Social History Social History   Tobacco Use  . Smoking status: Never Smoker  . Smokeless tobacco: Never Used  Substance Use Topics  . Alcohol use: No  . Drug use: No     Allergies   Latex, Penicillins, and Sulfonamide derivatives   Review of Systems Review of Systems  Unable to perform ROS: Mental status change  Constitutional: Positive for chills and fatigue. Negative for diaphoresis and fever.  HENT: Negative for congestion.   Respiratory: Negative for cough, chest tightness, shortness of breath and wheezing.   Cardiovascular: Negative for chest pain, palpitations and leg swelling.  Gastrointestinal: Negative for  abdominal pain, constipation, diarrhea, nausea and vomiting.  Genitourinary: Negative for dysuria.  Musculoskeletal: Positive for back pain (with wounds). Negative for neck pain and neck stiffness.  Neurological: Negative for light-headedness and headaches.  Psychiatric/Behavioral: Positive for confusion.  All other systems reviewed and are negative.    Physical Exam Updated Vital Signs BP 137/85   Pulse (!) 126   Temp 100.3 F (37.9 C) (Rectal)   Resp 17   SpO2 97%   Physical Exam Vitals signs and nursing note reviewed.  Constitutional:      General: She is not in acute distress.    Appearance: She is well-developed. She is ill-appearing. She is not toxic-appearing or diaphoretic.   HENT:     Head: Normocephalic and atraumatic.     Right Ear: External ear normal.     Left Ear: External ear normal.     Nose: Nose normal. No congestion or rhinorrhea.     Mouth/Throat:     Mouth: Mucous membranes are moist.     Pharynx: No oropharyngeal exudate or posterior oropharyngeal erythema.  Eyes:     Extraocular Movements: Extraocular movements intact.     Conjunctiva/sclera: Conjunctivae normal.     Pupils: Pupils are equal, round, and reactive to light.  Neck:     Musculoskeletal: Normal range of motion and neck supple. No muscular tenderness.  Cardiovascular:     Rate and Rhythm: Normal rate.     Pulses: Normal pulses.     Heart sounds: No murmur.  Pulmonary:     Effort: No respiratory distress.     Breath sounds: No stridor. Rhonchi present. No wheezing or rales.  Chest:     Chest wall: No tenderness.  Abdominal:     General: Abdomen is flat. There is no distension.     Tenderness: There is no abdominal tenderness. There is no right CVA tenderness, left CVA tenderness or rebound.  Musculoskeletal:        General: Tenderness present.     Right knee: She exhibits deformity. Tenderness found.     Right ankle: She exhibits deformity. She exhibits normal pulse. Tenderness.     Comments: Tenderness in the right knee and right ankle.  She has palpable DP and PT pulse in the right foot.  She has symmetric decreased sensation in both feet but has normal strength in the foot.  There is some deformity in her right knee and right ankle.   Patient has multiple wounds and ulcers on her back likely from being down for so long.  There is surrounding erythema but no drainage.  They are tender.  No other midline tenderness present.  Skin:    General: Skin is warm.     Capillary Refill: Capillary refill takes less than 2 seconds.     Findings: Erythema and rash present.  Neurological:     General: No focal deficit present.     Mental Status: She is alert.     Sensory: No sensory  deficit.     Motor: No weakness or abnormal muscle tone.     Coordination: Coordination normal.     Deep Tendon Reflexes: Reflexes are normal and symmetric.  Psychiatric:        Mood and Affect: Mood normal.           ED Treatments / Results  Labs (all labs ordered are listed, but only abnormal results are displayed) Labs Reviewed  CK - Abnormal; Notable for the following components:  Result Value   Total CK 655 (*)    All other components within normal limits  CBC WITH DIFFERENTIAL/PLATELET - Abnormal; Notable for the following components:   WBC 15.1 (*)    Neutro Abs 13.5 (*)    Lymphs Abs 0.6 (*)    Abs Immature Granulocytes 0.09 (*)    All other components within normal limits  COMPREHENSIVE METABOLIC PANEL - Abnormal; Notable for the following components:   Sodium 153 (*)    Chloride 112 (*)    CO2 20 (*)    Glucose, Bld 114 (*)    BUN 58 (*)    Creatinine, Ser 1.36 (*)    AST 60 (*)    Total Bilirubin 1.3 (*)    GFR calc non Af Amer 35 (*)    GFR calc Af Amer 40 (*)    Anion gap 21 (*)    All other components within normal limits  LACTIC ACID, PLASMA - Abnormal; Notable for the following components:   Lactic Acid, Venous 2.2 (*)    All other components within normal limits  URINALYSIS, ROUTINE W REFLEX MICROSCOPIC - Abnormal; Notable for the following components:   APPearance HAZY (*)    Hgb urine dipstick MODERATE (*)    Ketones, ur 20 (*)    Bacteria, UA RARE (*)    All other components within normal limits  TROPONIN I (HIGH SENSITIVITY) - Abnormal; Notable for the following components:   Troponin I (High Sensitivity) 42 (*)    All other components within normal limits  CULTURE, BLOOD (ROUTINE X 2)  CULTURE, BLOOD (ROUTINE X 2)  SARS CORONAVIRUS 2 (TAT 6-24 HRS)  LIPASE, BLOOD  MAGNESIUM  TSH  LACTIC ACID, PLASMA  POC SARS CORONAVIRUS 2 AG -  ED  TROPONIN I (HIGH SENSITIVITY)    EKG EKG Interpretation  Date/Time:  Tuesday December 31 2018 13:54:48 EST Ventricular Rate:  119 PR Interval:    QRS Duration: 88 QT Interval:  344 QTC Calculation: 484 R Axis:   81 Text Interpretation: sinus tachycardia Probable anterior infarct, age indeterminate Artifact in lead(s) I II III aVR aVL aVF V1 V2 V4 V5 When compared to prior, faster rate and more artifact. No STEMI Confirmed by Theda Belfastegeler, Chris ([id number]) on 12/31/2018 1:56:14 PM   Radiology Ct Head Wo Contrast  Result Date: 12/31/2018 CLINICAL DATA:  Altered level of consciousness, unexplained fall, down for 6 days EXAM: CT HEAD WITHOUT CONTRAST TECHNIQUE: Contiguous axial images were obtained from the base of the skull through the vertex without intravenous contrast. COMPARISON:  None. FINDINGS: Brain: No evidence of acute infarction, hemorrhage, hydrocephalus, extra-axial collection or mass lesion/mass effect. Symmetric prominence of the ventricles, cisterns and sulci compatible with parenchymal volume loss. Patchy areas of white matter hypoattenuation are most compatible with chronic microvascular angiopathy. Vascular: Atherosclerotic calcification of the carotid siphons and intradural vertebral arteries. No hyperdense vessel. Skull: No calvarial fracture or suspicious osseous lesion. No scalp swelling or hematoma. Sinuses/Orbits: Paranasal sinuses and mastoid air cells are predominantly clear. Orbital structures are unremarkable aside from prior lens extractions. Other: None IMPRESSION: 1. No acute intracranial abnormality. 2. Moderate parenchymal atrophy and chronic microvascular angiopathy. Electronically Signed   By: Kreg ShropshirePrice  DeHay [name]   On: 12/31/2018 15:35    Procedures Procedures (including critical care time)  Medications Ordered in ED Medications  sodium chloride 0.9 % bolus 1,000 mL (1,000 mLs Intravenous New Bag/Given 12/31/18 1428)     Initial Impression / Assessment and Plan / ED  Course  I have reviewed the triage vital signs and the nursing notes.  Pertinent labs &  imaging results that were available during my care of the patient were reviewed by me and considered in my medical decision making (see chart for details).        Emily Camacho is a 85 y.o. female with a past medical history significant for hypertension, dyslipidemia, GERD, and prior Tocco Subu cardiomyopathy who presents for altered mental status and prolonged downtime.  According to EMS, [employer] called the fire department when they had for meals stacking up on the patient's front door step.  This correlates to around 6 days of no interaction with the patient.  Fire broke down the door and found the patient lying on the ground.  Patient has some discoloration and pain in her right ankle/foot as well as pain in her right knee.  She has discoloration to her back and was laying on her back.  She is reporting some chills but is confused.  She does know she is at [facility] but is not able to say if she is having any pain in her chest or abdomen.  She denies cough.  On exam, patient is found to have a rectal temperature 100.3.  She is tachycardic on arrival.  Patient has significant bruising, ecchymoses, and wounds on her back likely from prolonged downtime.  She also has discoloration to her right foot although I was able to get a DP and PT pulse on Doppler on the right ankle.  She denies any pain in the actual foot and is able to wiggle both feet.  She has tenderness with ankle manipulation and with knee manipulation on the right.  No hip tenderness.  She did not have significant midline back tenderness but did have pain at the sites of multiple wounds.  Lungs were coarse bilaterally.  Abdomen and chest were nontender.  Clinically I am concerned about rhabdomyolysis given the 6 days of downtime, injury to her right ankle or right knee, and other infection causing her downtime and altered mental status.  Based on her pain in the right leg, will get x-rays to look for dislocation or fracture in the  right ankle or right knee.  She does have pulses in the right leg.  Is is not swollen.  Doubt DVT initially.  We will get CT head, labs, and imaging of the chest, pelvis, right knee and, right ankle.  Anticipate admission after work-up.  4:04 PM Patient's labs began to return.  CK elevated at 655 she has elevation in creatinine.  Suspect dehydration and rhabdomyolysis.  Sodium elevated at 153.  Leukocytosis present.  No anemia.  Urinalysis does not show infection but does show ketones.  Lactic acid elevated at 2.2, will trend.  Lipase not elevated.  Magnesium normal.  CT head shows no acute intracranial abnormality.  Care transferred to Dr. Denton Lank while awaiting for results of x-rays of the right ankle, right knee, pelvis, and chest.  Anticipate admission for the dehydration and rhabdo after imaging is completed.   Final Clinical Impressions(s) / ED Diagnoses   Final diagnoses:  Dehydration  Fall, initial encounter  Traumatic rhabdomyolysis, initial encounter (HCC)  Hypernatremia    Clinical Impression: 1. Dehydration   2. AMS (altered mental status)   3. Fall   4. Fall, initial encounter   5. Traumatic rhabdomyolysis, initial encounter (HCC)   6. Hypernatremia     Disposition: Admit  This note was prepared with  assistance of Conservation officer, historic buildings. Occasional wrong-word or sound-a-like substitutions may have occurred due to the inherent limitations of voice recognition software.     Kendelle Schweers, Canary Brim, MD 12/31/18 [phone]

## 2018-12-31 NOTE — H&P (Signed)
History and Physical  QUERIDA BERETTA RWE:[id number] DOB: [birth year] DOA: 12/31/2018  PCP: [name], DO   Chief Complaint: Confusion found down fall  HPI:  10 WF HTN, Takotsubo cardiomyopathy with systolic heart failure on cath 08/14/2009, HTN, HLD, prior cirrhosis, prior DVT (details unknown), bladder prolapse status post repair 2003  Details unknown-found down on floor covered feces urine cat food on ground for 6 days found with pressure ulcers prior to admission stage II upper back unstageable sacrum-severe AKI (baseline 21/0.7) 58/1.3 CO2 20 sodium 153 WBC 15  CK 655 Troponin 42 lactic acid 2.2-->2.0  low-grade fever 100.3   ED Course: Ns bolus 1000 cc  Review of Systems:  + Fever, + mild confusion-thinks that she was on the ground for only 45 minutes where she was on the floor for 6 days Can tell me she is at [facility] knows the president outgoing is Trump etc. also confabulates details about since when she got [employer] + Weight loss may be 20 pounds - Chest pain, - chills, - diarrhea - blurred vision - double vision - abdominal pain - nausea - vomiting - rash - cough - cold - exposure to coronavirus  Past Medical History:  Diagnosis Date   Arthritis    Cardiomyopathy    tako tsubo   Dyslipidemia    Epistaxis    GERD (gastroesophageal reflux disease)    HTN (hypertension)     Past Surgical History:  Procedure Laterality Date   KNEE SURGERY  90's   TOE SURGERY     TOTAL ABDOMINAL HYSTERECTOMY W/ BILATERAL SALPINGOOPHORECTOMY     States that she gets around with a walker however I find this hard to believe given her sacral decubiti  reports that she has never smoked. She has never used smokeless tobacco. She reports that she does not drink alcohol or use drugs.  Allergies  Allergen Reactions   Penicillins     Did it involve swelling of the face/tongue/throat, SOB, or low BP? no Did it involve sudden or severe rash/hives, skin peeling, or  any reaction on the inside of your mouth or nose? Yes  Did you need to seek medical attention at a hospital or doctor's office? N/A When did it last happen?unknown If all above answers are NO, may proceed with cephalosporin use.   Sulfonamide Derivatives    Latex Rash    Family History  Problem Relation Age of Onset   Cancer Mother        colon     Prior to Admission medications   Medication Sig Start Date End Date Taking? Authorizing Provider  aspirin 325 MG tablet Take 325 mg by mouth daily.     Yes [provider]  Calcium 45 MG CAPS daily    Yes [provider]  Cholecalciferol (VITAMIN D) 400 UNITS capsule Take 400 Units by mouth 2 (two) times daily.     Yes [provider]  furosemide (LASIX) 20 MG tablet Take 1 tablet (20 mg total) by mouth daily. 06/24/12  Yes Lelon Perla, MD  lisinopril (PRINIVIL,ZESTRIL) 40 MG tablet Take 1 tablet (40 mg total) by mouth daily. 08/29/11  Yes Lelon Perla, MD  metoprolol succinate (TOPROL-XL) 25 MG 24 hr tablet Take 50 mg by mouth daily. 10/05/10  Yes Lelon Perla, MD  Multiple Vitamins-Minerals (CENTRUM PO) 1 tab po qd    Yes [provider]  oxybutynin (DITROPAN) 5 MG tablet Take 5 mg by mouth 3 (  three) times daily.     Yes [provider]  simvastatin (ZOCOR) 20 MG tablet Take 20 mg by mouth at bedtime.     Yes [provider]  spironolactone (ALDACTONE) 25 MG tablet Take 25 mg by mouth daily.   Yes [provider]    Physical Exam:   Asthenic very cachectic bitemporal wasting supraclavicular wasting  Chest clear no added sound tachycardic 120s to 130s   Abdomen scaphoid no tenderness no rebound  Postoperative changes to right ankle and it is permanently externally rotated with the foot both ankles are red  I did not examine sacral decubiti given lack of support at the ED  Neurologically does seem to have normal power however very poor effort can  hardly get her finger to her nose smile seems symmetric sensory is intact but she complains when touched  I have personally reviewed following labs and imaging studies  Labs:   As above    Imaging studies:   Multiple imaging studies from admission CTs and chest x-rays do not show any specific fracture  She does have osteonecrosis of both hips  Medical tests:   EKG independently reviewed: Sinus, sinus tach rate related changes V4 through V5 poor tracing  Test discussed with performing physician:  Yes discussed with Dr. Madaline Guthrie  Decision to obtain old records:   Yes reviewed  Review and summation of old records:   Yes summarized as above  Active Problems:   * No active hospital problems. *   Assessment/Plan Acute dehydration metabolic acidosis AKI hypernatremia likely from volume depletion Give D5 NS over the next 1 to 2 days expect sodium will come down otherwise change to D5 alone-acidosis should resolve some component of starvation ketosis in addition-might need nutritionist input  Osteonecrosis bilateral hips-hard to believe that she ambulates or does any type of mobility we will need therapy to see the patient in consult--she is an exceedingly poor candidate for any type of surgery or hip replacement given her frailty and will need significant improvement in nutrition prior to any consideration for this  History Takotsubo cardiomyopathy holding lisinopril 40 Lasix 20 Aldactone 25 at this time can use metoprolol 50 XL daily-tachycardia slightly reflexively from not taking this over the past several days  History of prolapsed uterus status post repair and probable overflow incontinence-continue oxybutynin when more awake alert  ?  Adult neglect APS has been consulted given history wherein she was found down and had not had any contact with family for 6 days social work to follow-up and discuss with family and planning-suggest placement doubt able to reside in the  community any longer  Severe adult failure to thrive with severe malnutrition and sacral decubiti on admission will need nutritionist input frequent turning and wound care input regarding sacral decubiti  Severity of Illness: The appropriate patient status for this patient is INPATIENT. Inpatient status is judged to be reasonable and necessary in order to provide the required intensity of service to ensure the patient's safety. The patient's presenting symptoms, physical exam findings, and initial radiographic and laboratory data in the context of their chronic comorbidities is felt to place them at high risk for further clinical deterioration. Furthermore, it is not anticipated that the patient will be medically stable for discharge from the hospital within 2 midnights of admission. The following factors support the patient status of inpatient.   " The patient's presenting symptoms include dehydration tachycardia and confusional state. " The worrisome physical exam findings include sacral  decubiti very weak. " The initial radiographic and laboratory data are worrisome because of hyponatremia AKI acidosis. " The chronic co-morbidities include frailty.   * I certify that at the point of admission it is my clinical judgment that the patient will require inpatient hospital care spanning beyond 2 midnights from the point of admission due to high intensity of service, high risk for further deterioration and high frequency of surveillance required.*     DVT prophylaxis: Lovenox Code Status: Full confirmed at bedside Family Communication: Discussed with son Brett Canales at bedside Consults called: None  Time spent: 50 minutes  Pleas Koch, MD [facility] 5:49 PM   12/31/2018, 5:13 PM

## 2018-12-31 NOTE — ED Notes (Signed)
Family at bedside. 

## 2018-12-31 NOTE — ED Triage Notes (Signed)
Per [facility] pt coming from home. Patient was found lying on floor. [employer] called since patient did not answer door. Law enforcement came out to home and found patient laying on ground underneath shredded paper. Patient was covered in urine, cat food and had a plastic bag with towel inside of her underwear. Patient states she is unsure if she passed out. Approximating patient has been on ground for about 6 days. Patient alert to self and location. Patient found to have pressure ulcer stage 2 to mid upper back and unstagable pressure ulcer to sacrum. Patient cleaned and foam dressings applied to pressure wounds.

## 2018-12-31 NOTE — ED Notes (Signed)
Patient transported to CT 

## 2019-01-01 ENCOUNTER — Inpatient Hospital Stay (HOSPITAL_COMMUNITY): Payer: Medicare Other

## 2019-01-01 ENCOUNTER — Encounter (HOSPITAL_COMMUNITY): Payer: Self-pay | Admitting: Emergency Medicine

## 2019-01-01 DIAGNOSIS — E860 Dehydration: Secondary | ICD-10-CM

## 2019-01-01 LAB — COMPREHENSIVE METABOLIC PANEL
ALT: 30 U/L (ref 0–44)
AST: 42 U/L — ABNORMAL HIGH (ref 15–41)
Albumin: 2.7 g/dL — ABNORMAL LOW (ref 3.5–5.0)
Alkaline Phosphatase: 57 U/L (ref 38–126)
Anion gap: 13 (ref 5–15)
BUN: 51 mg/dL — ABNORMAL HIGH (ref 8–23)
CO2: 26 mmol/L (ref 22–32)
Calcium: 9.2 mg/dL (ref 8.9–10.3)
Chloride: 115 mmol/L — ABNORMAL HIGH (ref 98–111)
Creatinine, Ser: 1.2 mg/dL — ABNORMAL HIGH (ref 0.44–1.00)
GFR calc Af Amer: 47 mL/min — ABNORMAL LOW (ref >60)
GFR calc non Af Amer: 40 mL/min — ABNORMAL LOW (ref >60)
Glucose, Bld: 173 mg/dL — ABNORMAL HIGH (ref 70–99)
Potassium: 2.9 mmol/L — ABNORMAL LOW (ref 3.5–5.1)
Sodium: 154 mmol/L — ABNORMAL HIGH (ref 135–145)
Total Bilirubin: 0.9 mg/dL (ref 0.3–1.2)
Total Protein: 6.5 g/dL (ref 6.5–8.1)

## 2019-01-01 LAB — BLOOD CULTURE ID PANEL (REFLEXED)

## 2019-01-01 LAB — CBC WITH DIFFERENTIAL/PLATELET
Abs Immature Granulocytes: 0.04 10*3/uL (ref 0.00–0.07)
Basophils Absolute: 0 10*3/uL (ref 0.0–0.1)
Basophils Relative: 0 %
Eosinophils Absolute: 0 10*3/uL (ref 0.0–0.5)
Eosinophils Relative: 0 %
HCT: 37.7 % (ref 36.0–46.0)
Hemoglobin: 11.7 g/dL — ABNORMAL LOW (ref 12.0–15.0)
Immature Granulocytes: 0 %
Lymphocytes Relative: 6 %
Lymphs Abs: 0.6 10*3/uL — ABNORMAL LOW (ref 0.7–4.0)
MCH: 30.2 pg (ref 26.0–34.0)
MCHC: 31 g/dL (ref 30.0–36.0)
MCV: 97.4 fL (ref 80.0–100.0)
Monocytes Absolute: 0.9 10*3/uL (ref 0.1–1.0)
Monocytes Relative: 9 %
Neutro Abs: 8.6 10*3/uL — ABNORMAL HIGH (ref 1.7–7.7)
Neutrophils Relative %: 85 %
Platelets: 268 10*3/uL (ref 150–400)
RBC: 3.87 MIL/uL (ref 3.87–5.11)
RDW: 14.5 % (ref 11.5–15.5)
WBC: 10.1 10*3/uL (ref 4.0–10.5)
nRBC: 0 % (ref 0.0–0.2)

## 2019-01-01 LAB — BASIC METABOLIC PANEL
Anion gap: 9 (ref 5–15)
BUN: 53 mg/dL — ABNORMAL HIGH (ref 8–23)
CO2: 22 mmol/L (ref 22–32)
Calcium: 8.7 mg/dL — ABNORMAL LOW (ref 8.9–10.3)
Chloride: 118 mmol/L — ABNORMAL HIGH (ref 98–111)
Creatinine, Ser: 1.15 mg/dL — ABNORMAL HIGH (ref 0.44–1.00)
GFR calc Af Amer: 49 mL/min — ABNORMAL LOW (ref >60)
GFR calc non Af Amer: 42 mL/min — ABNORMAL LOW (ref >60)
Glucose, Bld: 146 mg/dL — ABNORMAL HIGH (ref 70–99)
Potassium: 4.5 mmol/L (ref 3.5–5.1)
Sodium: 149 mmol/L — ABNORMAL HIGH (ref 135–145)

## 2019-01-01 LAB — CK: Total CK: 372 U/L — ABNORMAL HIGH (ref 38–234)

## 2019-01-01 LAB — MAGNESIUM: Magnesium: 2.1 mg/dL (ref 1.7–2.4)

## 2019-01-01 MED ORDER — POTASSIUM CHLORIDE 20 MEQ/15ML (10%) PO SOLN: 40.0000 meq | Freq: Once | ORAL | Status: DC

## 2019-01-01 MED ORDER — POTASSIUM CHLORIDE 10 MEQ/100ML IV SOLN
10.0000 meq | INTRAVENOUS | Status: AC
  Administered 2019-01-01 (×2): 10 meq via INTRAVENOUS
  Filled 2019-01-01 (×2): qty 100

## 2019-01-01 MED ORDER — ASPIRIN 300 MG RE SUPP
300.0000 mg | Freq: Once | RECTAL | Status: AC
  Administered 2019-01-01: 300 mg via RECTAL
  Filled 2019-01-01: qty 1

## 2019-01-01 MED ORDER — DEXTROSE-NACL 5-0.45 % IV SOLN
INTRAVENOUS | Status: DC
  Administered 2019-01-01: 23:00:00 via INTRAVENOUS

## 2019-01-01 MED ORDER — COLLAGENASE 250 UNIT/GM EX OINT
TOPICAL_OINTMENT | Freq: Every day | CUTANEOUS | Status: AC
  Administered 2019-01-01: 17:00:00 via TOPICAL
  Filled 2019-01-01: qty 30

## 2019-01-01 MED ORDER — ACETAMINOPHEN 650 MG RE SUPP
650.0000 mg | RECTAL | Status: DC | PRN
  Administered 2019-01-04: 650 mg via RECTAL
  Filled 2019-01-01: qty 1

## 2019-01-01 MED ORDER — SODIUM CHLORIDE 0.9 % IV SOLN
2.0000 g | INTRAVENOUS | Status: DC
  Administered 2019-01-01 – 2019-01-02 (×2): 2 g via INTRAVENOUS
  Filled 2019-01-01: qty 20
  Filled 2019-01-01: qty 2
  Filled 2019-01-01: qty 20

## 2019-01-01 MED ORDER — POTASSIUM CHLORIDE 2 MEQ/ML IV SOLN: INTRAVENOUS | Status: DC

## 2019-01-01 MED ORDER — KCL IN DEXTROSE-NACL 40-5-0.45 MEQ/L-%-% IV SOLN
INTRAVENOUS | Status: DC
  Administered 2019-01-01: 12:00:00 via INTRAVENOUS
  Filled 2019-01-01: qty 1000

## 2019-01-01 NOTE — Consult Note (Signed)
WOC Nurse Consult Note: Reason for Consult: Mid thoracic Unstageable pressure injury, Deep Tissue Pressure injuries to sacrum, right lateral foot Wound type:Pressure.  Note:  Patient found down for extended period of time. Pressure Injury POA: Yes Measurement: Mid-thoracic:  6cm x 3cm with depth obscured by the presence of necrotic tissue. Small amount of serosanguinous exudate Sacral:  5cm x 14cm with deep purple/maroon tissue discoloration with peeling epidermis.  Expect this area to evolve into a full thickess pressure injury. Serosanguinous exudate in a moderate amount. Right lateral foot (midfoot): 5cm x 5cm area of nonblanching erythema, maroon in color (DTPI) Right lateral foot (hindfoot): 1cm round deep purple discoloration  Wound bed:As described above Drainage (amount, consistency, odor) As described above Periwound: Upper back is blanchable erythema Dressing procedure/placement/frequency: I have provided Nursing with guidance for care of the wounds and skin.  Patient is incontinent.  She requires a mattress replacement with low air loss feature, bilateral pressure redistribution heel boots and topical care. Topical care to the DTPI at the sacrum, the most severe of her skin injuries is with xeroform gauze and silicone foam. This will be assessed twice daily as it is expected to evolve.  The thoracic wound will be treated with an enzymatic debriding agent (collagenase/Santyl) to debride the necrotic tissue.  [place] nursing team will not follow, but will remain available to this patient, the nursing and medical teams.  Please re-consult if needed. Thanks, Maudie Flakes, MSN, RN, [place], Arther Abbott  Pager# [phone]

## 2019-01-01 NOTE — ED Notes (Signed)
Sent A11 to materials to order prevlone boots

## 2019-01-01 NOTE — ED Notes (Signed)
Pt remains npo at this time , re swallow screen

## 2019-01-01 NOTE — ED Notes (Signed)
Called for report to [facility]M; Nurse will be ready in 10-15 minutes.

## 2019-01-01 NOTE — Progress Notes (Signed)
PHARMACY - PHYSICIAN COMMUNICATION CRITICAL VALUE ALERT - BLOOD CULTURE IDENTIFICATION (BCID)  Emily Camacho is an 85 y.o. female who presented to [facility] on 12/31/2018 with a chief complaint of being found down s/p fall. Patient was found down at home for an unknown amount of time and was covered in urine feces and cat food.   Assessment: 85 year old female admitted after being found down. Noted to have ulcers to her sacrum. Her blood cultures from admission are growing proteus in 1/4 and gm positive cocci in clusters in 1/4. The gram positive cocci could be a contaminant but the proteus represents true infection. Patient Tmax-100.3 and WBC down to 10.1.   Name of physician (or Provider) Contacted: Antonieta Pert   Current antibiotics: None   Changes to prescribed antibiotics recommended:  Start ceftriaxone 2 gm every 24 hours Patient has a remote history of rash/hives to penicillins but cross-reactivity rate is low to ceftriaxone so feel comfortable starting   Results for orders placed or performed during the hospital encounter of 12/31/18  Blood Culture ID Panel (Reflexed) (Collected: 12/31/2018  2:20 PM)  Result Value Ref Range   Enterococcus species NOT DETECTED NOT DETECTED   Listeria monocytogenes NOT DETECTED NOT DETECTED   Staphylococcus species NOT DETECTED NOT DETECTED   Staphylococcus aureus (BCID) NOT DETECTED NOT DETECTED   Streptococcus species NOT DETECTED NOT DETECTED   Streptococcus agalactiae NOT DETECTED NOT DETECTED   Streptococcus pneumoniae NOT DETECTED NOT DETECTED   Streptococcus pyogenes NOT DETECTED NOT DETECTED   Acinetobacter baumannii NOT DETECTED NOT DETECTED   Enterobacteriaceae species DETECTED (A) NOT DETECTED   Enterobacter cloacae complex NOT DETECTED NOT DETECTED   Escherichia coli NOT DETECTED NOT DETECTED   Klebsiella oxytoca NOT DETECTED NOT DETECTED   Klebsiella pneumoniae NOT DETECTED NOT DETECTED   Proteus species DETECTED (A) NOT DETECTED   Serratia marcescens NOT DETECTED NOT DETECTED   Carbapenem resistance NOT DETECTED NOT DETECTED   Haemophilus influenzae NOT DETECTED NOT DETECTED   Neisseria meningitidis NOT DETECTED NOT DETECTED   Pseudomonas aeruginosa NOT DETECTED NOT DETECTED   Candida albicans NOT DETECTED NOT DETECTED   Candida glabrata NOT DETECTED NOT DETECTED   Candida krusei NOT DETECTED NOT DETECTED   Candida parapsilosis NOT DETECTED NOT DETECTED   Candida tropicalis NOT DETECTED NOT DETECTED    Jimmy Footman, PharmD, BCPS, BCIDP Infectious Diseases Clinical Pharmacist Phone: [phone] 01/01/2019  11:54 AM

## 2019-01-01 NOTE — Evaluation (Signed)
Physical Therapy Evaluation Patient Details Name: Emily Camacho MRN: [id number] DOB: November 24, 1928 Today's Date: 01/01/2019   History of Present Illness  89 WF HTN, Takotsubo cardiomyopathy with systolic heart failure on cath 08/14/2009, HTN, HLD, prior cirrhosis, prior DVT (details unknown), and bladder prolapse status post repair 2003.  Details unknown-found down on floor covered in feces, urine, and cat food. She was on ground for possibly 6 days. Found with pressure ulcers on back and sacrum.   Clinical Impression   Pt admitted with above diagnosis. PTA pt lived at home alone. She ambulated with a RW and was mod I ADLs. She did receive meals on wheels. Unsure how mobile she has been recently due to large sacral pressure sore. On eval, pt required total assist rolling in bed. Unable to tolerate further mobility due to pain. Pt currently with functional limitations due to the deficits listed below (see PT Problem List). Pt will benefit from skilled PT to increase their independence and safety with mobility to allow discharge to the venue listed below.       Follow Up Recommendations SNF    Equipment Recommendations  Other (comment)(TBD)    Recommendations for Other Services       Precautions / Restrictions Precautions Precautions: Fall      Mobility  Bed Mobility Overal bed mobility: Needs Assistance Bed Mobility: Rolling Rolling: Total assist         General bed mobility comments: Unable to tolerate OOB due to pain. Assisted nursing with transfer to new bed with air matress. Rolling in bed for pad placement.  Transfers                 General transfer comment: unable  Ambulation/Gait                Stairs            Wheelchair Mobility    Modified Rankin (Stroke Patients Only)       Balance                                             Pertinent Vitals/Pain Pain Assessment: Faces Faces Pain Scale: Hurts worst Pain  Location: generalized, "all over" Pain Descriptors / Indicators: Grimacing;Moaning;Guarding;Tender Pain Intervention(s): Limited activity within patient's tolerance;Repositioned    Home Living Family/patient expects to be discharged to:: Private residence Living Arrangements: Alone Available Help at Discharge: Family;Friend(s);Neighbor;Available PRN/intermittently(no local family. Son and daughter in law live in [place].) Type of Home: House Home Access: Ramped entrance     [place]: One level [place]: Environmental consultant - 2 wheels      Prior Function Level of Independence: Independent with assistive device(s)         Comments: receives meals on wheels     Hand Dominance        Extremity/Trunk Assessment   Upper Extremity Assessment Upper Extremity Assessment: Defer to OT evaluation    Lower Extremity Assessment Lower Extremity Assessment: LLE deficits/detail;RLE deficits/detail RLE Deficits / Details: possible congenital abnormality or 'fallen arch'. Foot in pronation. Pt states it has always been that way. RLE: Unable to fully assess due to pain LLE: Unable to fully assess due to pain    Cervical / Trunk Assessment Cervical / Trunk Assessment: Kyphotic  Communication   Communication: No difficulties  Cognition Arousal/Alertness: Awake/alert Behavior During Therapy: WFL for tasks assessed/performed Overall Cognitive  Status: Impaired/Different from baseline Area of Impairment: Memory;Safety/judgement                     Memory: Decreased short-term memory   Safety/Judgement: Decreased awareness of deficits     General Comments: Poor historian. Confubulates. Confuses her timeline (as in stating she recently fell at a museum in [place]. And that she had gotten to the museum by taking a bus then a taxi.) Easily distracted by pain with any movement. Stating she cannot go to a nursing home because there is no one to take care of her cats.      General Comments       Exercises     Assessment/Plan    PT Assessment Patient needs continued PT services  PT Problem List Decreased strength;Decreased mobility;Decreased range of motion;Decreased knowledge of precautions;Decreased activity tolerance;Decreased balance;Pain       PT Treatment Interventions DME instruction;Therapeutic exercise;Gait training;Balance training;Functional mobility training;Therapeutic activities;Patient/family education    PT Goals (Current goals can be found in the Care Plan section)  Acute Rehab PT Goals Patient Stated Goal: home PT Goal Formulation: With patient/family Time For Goal Achievement: 01/15/19 Potential to Achieve Goals: Fair    Frequency Min 2X/week   Barriers to discharge        Co-evaluation               AM-PAC PT "6 Clicks" Mobility  Outcome Measure Help needed turning from your back to your side while in a flat bed without using bedrails?: Total Help needed moving from lying on your back to sitting on the side of a flat bed without using bedrails?: Total Help needed moving to and from a bed to a chair (including a wheelchair)?: Total Help needed standing up from a chair using your arms (e.g., wheelchair or bedside chair)?: Total Help needed to walk in hospital room?: Total Help needed climbing 3-5 steps with a railing? : Total 6 Click Score: 6    End of Session   Activity Tolerance: Patient limited by pain Patient left: in bed;with family/visitor present;with call bell/phone within reach Nurse Communication: Mobility status PT Visit Diagnosis: Other abnormalities of gait and mobility (R26.89);Pain;History of falling (Z91.81);Muscle weakness (generalized) (M62.81)    Time: 7253-6644 PT Time Calculation (min) (ACUTE ONLY): 34 min   Charges:   PT Evaluation $PT Eval Moderate Complexity: 1 Mod PT Treatments $Therapeutic Activity: 8-22 mins        Aida Raider, PT  Office # [phone] Pager [phone]   Ilda Foil 01/01/2019, 1:56 PM

## 2019-01-01 NOTE — ED Notes (Signed)
purwick reposition

## 2019-01-01 NOTE — ED Notes (Addendum)
Cleansed sacral and back wounds with water; applied Santyl Cream to both wounds and changed dressings. Pt complained of pain during movement but no pain currently at rest.

## 2019-01-01 NOTE — ED Notes (Signed)
Bladder scan showed 490ml.
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
## 2019-01-01 NOTE — ED Notes (Signed)
Pt placed on air bed repositioned meds started daughter in law in room , awaits ready bed

## 2019-01-01 NOTE — ED Notes (Signed)
Pt placed on air mattress bed.

## 2019-01-01 NOTE — ED Notes (Addendum)
Pt requesting to drink water, oral care performed, copious amounts of thick build up and skin removed from mouth and lips. Pt provided small amount of water on sponge and immediately began coughing reporting difficulty swallowing. Speech eval ordered.   Patients eyes caked closed with crusty yellow exudate, warm washrag used to clean eyes, pt was able to open eyes on command after cleaning.

## 2019-01-01 NOTE — ED Notes (Signed)
Tele   Breakfast ordered  

## 2019-01-01 NOTE — TOC Initial Note (Signed)
Transition of Care [facility]) - Initial/Assessment Note    Patient Details  Name: Emily Camacho MRN: [id number] Date of Birth: 08-25-1928  Transition of Care [facility]) CM/SW Contact:    Vergie Living, LCSW Phone Number: 01/01/2019, 11:54 AM  Clinical Narrative:  CSW assessed PT in room. Daughter-in-law was also present during meeting. Pt stated that she believe that she had been in [place] shopping when she had suddenly passed out and had awoken in ED. PT also spoke of having a boyfriend, and gave other information that was not consistent with current situation.   D-I-L spoke separately to CSW to express concerns about cognitive functioning of PT.   PT was upbeat and pleasant and stated that she wished to return to her home upon discharge.            Expected Discharge Plan: Home/Self Care Barriers to Discharge: Continued Medical Work up   Patient Goals and CMS Choice        Expected Discharge Plan and Services Expected Discharge Plan: Home/Self Care       Living arrangements for the past 2 months: Single Family Home                                      Prior Living Arrangements/Services Living arrangements for the past 2 months: Single Family Home Lives with:: Self Patient language and need for interpreter reviewed:: Yes(Native English speaker) Do you feel safe going back to the place where you live?: Yes      Need for Family Participation in Patient Care: Yes (Comment)   Current home services: Housekeeping, Meals on wheels Criminal Activity/Legal Involvement Pertinent to Current Situation/Hospitalization: No - Comment as needed  Activities of Daily Living      Permission Sought/Granted                  Emotional Assessment   Attitude/Demeanor/Rapport: Inconsistent Affect (typically observed): Pleasant Orientation: : Oriented to Self, Oriented to Place Alcohol / Substance Use: Never Used    Admission diagnosis:  Floor x 4 days Patient Active  Problem List   Diagnosis Date Noted  . Dehydration 12/31/2018  . CARDIOMYOPATHY 09/20/2009  . DYSLIPIDEMIA 09/15/2009  . HYPERTENSION 09/15/2009  . GERD 09/15/2009  . ARTHRITIS 09/15/2009  . EPISTAXIS 09/15/2009  . CHEST PAIN 09/15/2009   PCP:  [name], DO Pharmacy:   [id number] - [place], [place] [address] Phone: [phone] Fax: [phone]     Social Determinants of Health (SDOH) Interventions    Readmission Risk Interventions No flowsheet data found.

## 2019-01-01 NOTE — ED Notes (Signed)
Assumed care of patient, patient placed on inpatient stretcher for comfort while boarding in the ED. Pt yells when turned c/o back discomfort.  VSS, purewik in place.

## 2019-01-01 NOTE — ED Notes (Signed)
Pt requesting water given oral swab

## 2019-01-01 NOTE — ED Notes (Signed)
ED TO INPATIENT HANDOFF REPORT  ED Nurse Name and Phone #: Lunette Stands, Call ED  S Name/Age/Gender Emily Camacho 85 y.o. female Room/Bed: 006C/006C  Code Status   Code Status: Full Code  Home/SNF/Other Home Patient oriented to: self, place, time and situation Is this baseline? Yes   Triage Complete: Triage complete  Chief Complaint Floor x 4 days  Triage Note Per [facility] pt coming from home. Patient was found lying on floor. [employer] called since patient did not answer door. Law enforcement came out to home and found patient laying on ground underneath shredded paper. Patient was covered in urine, cat food and had a plastic bag with towel inside of her underwear. Patient states she is unsure if she passed out. Approximating patient has been on ground for about 6 days. Patient alert to self and location. Patient found to have pressure ulcer stage 2 to mid upper back and unstagable pressure ulcer to sacrum. Patient cleaned and foam dressings applied to pressure wounds.    Allergies Allergies  Allergen Reactions  . Penicillins     Did it involve swelling of the face/tongue/throat, SOB, or low BP? no Did it involve sudden or severe rash/hives, skin peeling, or any reaction on the inside of your mouth or nose? Yes  Did you need to seek medical attention at a hospital or doctor's office? N/A When did it last happen?unknown If all above answers are "NO", may proceed with cephalosporin use.  . Sulfonamide Derivatives   . Latex Rash    Level of Care/Admitting Diagnosis ED Disposition    ED Disposition Condition Comment   Admit  Hospital Area: [facility] [[id number]]  Level of Care: Telemetry Medical [[id number]]  Covid Evaluation: Confirmed COVID Negative  Date Laboratory Confirmed COVID Negative: 12/31/2018  Diagnosis: Dehydration [276.51.ICD-9-CM]  Admitting Physician: Nita Sells [phone]  Attending Physician: Nita Sells [phone]  Estimated length  of stay: 3 - 4 days  Certification:: I certify this patient will need inpatient services for at least 2 midnights  PT Class (Do Not Modify): Inpatient [101]  PT Acc Code (Do Not Modify): Private [1]       B Medical/Surgery History Past Medical History:  Diagnosis Date  . Arthritis   . Cardiomyopathy    tako tsubo  . Dyslipidemia   . Epistaxis   . GERD (gastroesophageal reflux disease)   . HTN (hypertension)   . Renal disorder    Past Surgical History:  Procedure Laterality Date  . CATARACT EXTRACTION     Bilateral eyes  . KNEE SURGERY  90's  . TOE SURGERY    . TOTAL ABDOMINAL HYSTERECTOMY W/ BILATERAL SALPINGOOPHORECTOMY       A IV Location/Drains/Wounds Patient Lines/Drains/Airways Status   Active Line/Drains/Airways    Name:   Placement date:   Placement time:   Site:   Days:   Peripheral IV 12/31/18 Left Antecubital   12/31/18    1355    Antecubital   1   Peripheral IV 01/01/19 Right Antecubital   01/01/19    0358    Antecubital   less than 1          Intake/Output Last 24 hours  Intake/Output Summary (Last 24 hours) at 01/01/2019 1847 Last data filed at 01/01/2019 1015 Gross per 24 hour  Intake 2100 ml  Output -  Net 2100 ml    Labs/Imaging Results for orders placed or performed during the hospital encounter of 12/31/18 (from the past 48 hour(s))  CK     Status: Abnormal   Collection Time: 12/31/18  1:57 PM  Result Value Ref Range   Total CK 655 (H) 38 - 234 U/L    Comment: Performed at [facility], [place] [address] [place], [place] [address]  CBC with Differential     Status: Abnormal   Collection Time: 12/31/18  1:57 PM  Result Value Ref Range   WBC 15.1 (H) 4.0 - 10.5 K/uL   RBC 4.59 3.87 - 5.11 MIL/uL   Hemoglobin 13.9 12.0 - 15.0 g/dL   HCT 43.9 36.0 - 46.0 %   MCV 95.6 80.0 - 100.0 fL   MCH 30.3 26.0 - 34.0 pg   MCHC 31.7 30.0 - 36.0 g/dL   RDW 14.4 11.5 - 15.5 %   Platelets 350 150 - 400 K/uL   nRBC 0.0 0.0 - 0.2 %   Neutrophils  Relative % 89 %   Neutro Abs 13.5 (H) 1.7 - 7.7 K/uL   Lymphocytes Relative 4 %   Lymphs Abs 0.6 (L) 0.7 - 4.0 K/uL   Monocytes Relative 6 %   Monocytes Absolute 0.9 0.1 - 1.0 K/uL   Eosinophils Relative 0 %   Eosinophils Absolute 0.0 0.0 - 0.5 K/uL   Basophils Relative 0 %   Basophils Absolute 0.0 0.0 - 0.1 K/uL   Immature Granulocytes 1 %   Abs Immature Granulocytes 0.09 (H) 0.00 - 0.07 K/uL    Comment: Performed at [facility] [address] [place], [place] [address]  Comprehensive metabolic panel     Status: Abnormal   Collection Time: 12/31/18  1:57 PM  Result Value Ref Range   Sodium 153 (H) 135 - 145 mmol/L   Potassium 3.8 3.5 - 5.1 mmol/L   Chloride 112 (H) 98 - 111 mmol/L   CO2 20 (L) 22 - 32 mmol/L   Glucose, Bld 114 (H) 70 - 99 mg/dL   BUN 58 (H) 8 - 23 mg/dL   Creatinine, Ser 1.36 (H) 0.44 - 1.00 mg/dL   Calcium 10.0 8.9 - 10.3 mg/dL   Total Protein 8.1 6.5 - 8.1 g/dL   Albumin 3.5 3.5 - 5.0 g/dL   AST 60 (H) 15 - 41 U/L   ALT 36 0 - 44 U/L   Alkaline Phosphatase 76 38 - 126 U/L   Total Bilirubin 1.3 (H) 0.3 - 1.2 mg/dL   GFR calc non Af Amer 35 (L) >60 mL/min   GFR calc Af Amer 40 (L) >60 mL/min   Anion gap 21 (H) 5 - 15    Comment: Performed at [facility], [place] [address] [place], [place] [address]  Lactic acid, plasma     Status: Abnormal   Collection Time: 12/31/18  1:57 PM  Result Value Ref Range   Lactic Acid, Venous 2.2 (HH) 0.5 - 1.9 mmol/L    Comment: CRITICAL RESULT CALLED TO, READ BACK BY AND VERIFIED WITH: RAND,K RN @ 2376 12/31/18 LEONARD,A Performed at [facility], [place] [address] [place], [place] [address]   Lipase, blood     Status: None   Collection Time: 12/31/18  1:57 PM  Result Value Ref Range   Lipase 30 11 - 51 U/L    Comment: Performed at [facility], [place] [address] [place], [place] [address]  Magnesium     Status: None   Collection Time: 12/31/18  1:57 PM  Result Value Ref Range   Magnesium 2.2 1.7 -  2.4 mg/dL    Comment: Performed at [facility], [place] [address], [place], [place] [address]  Troponin I (High Sensitivity)     Status: Abnormal   Collection Time: 12/31/18  1:57 PM  Result Value Ref Range   Troponin I (High Sensitivity) 42 (H) <18 ng/L    Comment: (NOTE) Elevated high sensitivity troponin I (hsTnI) values and significant  changes across serial measurements may suggest ACS but many other  chronic and acute conditions are known to elevate hsTnI results.  Refer to the Links section for chest pain algorithms and additional  guidance. Performed at [facility], [place] [address], [place], [place] [address]   TSH     Status: None   Collection Time: 12/31/18  1:57 PM  Result Value Ref Range   TSH 0.696 0.350 - 4.500 uIU/mL    Comment: Performed by a 3rd Generation assay with a functional sensitivity of <=0.01 uIU/mL. Performed at [facility], [place] [address], [place], [place] [address]   Blood culture (routine x 2)     Status: None (Preliminary result)   Collection Time: 12/31/18  2:05 PM   Specimen: BLOOD  Result Value Ref Range   Specimen Description BLOOD LEFT ANTECUBITAL    Special Requests      BOTTLES DRAWN AEROBIC AND ANAEROBIC Blood Culture adequate volume   Culture  Setup Time      GRAM POSITIVE COCCI IN CLUSTERS IN BOTH AEROBIC AND ANAEROBIC BOTTLES CRITICAL RESULT CALLED TO, READ BACK BY AND VERIFIED WITH: E. SINCLAIR PHARMD, AT 1047 01/01/19 BY D. VANHOOK Performed at [facility], [place] [address], [place], [place] [address]    Culture GRAM POSITIVE COCCI    Report Status PENDING   Urinalysis, Routine w reflex microscopic     Status: Abnormal   Collection Time: 12/31/18  2:20 PM  Result Value Ref Range   Color, Urine YELLOW YELLOW   APPearance HAZY (A) CLEAR   Specific Gravity, Urine 1.016 1.005 - 1.030   pH 5.0 5.0 - 8.0   Glucose, UA NEGATIVE NEGATIVE mg/dL   Hgb urine dipstick MODERATE (A) NEGATIVE   Bilirubin Urine NEGATIVE  NEGATIVE   Ketones, ur 20 (A) NEGATIVE mg/dL   Protein, ur NEGATIVE NEGATIVE mg/dL   Nitrite NEGATIVE NEGATIVE   Leukocytes,Ua NEGATIVE NEGATIVE   RBC / HPF 0-5 0 - 5 RBC/hpf   WBC, UA 0-5 0 - 5 WBC/hpf   Bacteria, UA RARE (A) NONE SEEN   Squamous Epithelial / LPF 0-5 0 - 5   Mucus PRESENT    Hyaline Casts, UA PRESENT    Amorphous Crystal PRESENT     Comment: Performed at [facility], [place] [address] [place], [place] [address]  Blood culture (routine x 2)     Status: None (Preliminary result)   Collection Time: 12/31/18  2:20 PM   Specimen: BLOOD  Result Value Ref Range   Specimen Description BLOOD RIGHT ANTECUBITAL    Special Requests      BOTTLES DRAWN AEROBIC AND ANAEROBIC Blood Culture adequate volume   Culture  Setup Time      GRAM NEGATIVE RODS IN BOTH AEROBIC AND ANAEROBIC BOTTLES CRITICAL RESULT CALLED TO, READ BACK BY AND VERIFIED WITH: Ezekiel Slocumb PharmD 11:55 01/01/19 ([email or web address])    Culture      CULTURE REINCUBATED FOR BETTER GROWTH Performed at [facility], [place] [address], [place], [place] [address]    Report Status PENDING  Blood Culture ID Panel (Reflexed)     Status: Abnormal   Collection Time: 12/31/18  2:20 PM  Result Value Ref Range   Enterococcus species NOT DETECTED NOT DETECTED   Listeria monocytogenes NOT DETECTED NOT DETECTED   Staphylococcus species NOT DETECTED NOT DETECTED   Staphylococcus aureus (BCID) NOT DETECTED NOT DETECTED   Streptococcus species NOT DETECTED NOT DETECTED   Streptococcus agalactiae NOT DETECTED NOT DETECTED   Streptococcus pneumoniae NOT DETECTED NOT DETECTED   Streptococcus pyogenes NOT DETECTED NOT DETECTED   Acinetobacter baumannii NOT DETECTED NOT DETECTED   Enterobacteriaceae species DETECTED (A) NOT DETECTED    Comment: Enterobacteriaceae represent a large family of gram-negative bacteria, not a single organism. CRITICAL RESULT CALLED TO, READ BACK BY AND VERIFIED WITH: Ezekiel Slocumb PharmD 11:55 01/01/19  ([email or web address])    Enterobacter cloacae complex NOT DETECTED NOT DETECTED   Escherichia coli NOT DETECTED NOT DETECTED   Klebsiella oxytoca NOT DETECTED NOT DETECTED   Klebsiella pneumoniae NOT DETECTED NOT DETECTED   Proteus species DETECTED (A) NOT DETECTED    Comment: CRITICAL RESULT CALLED TO, READ BACK BY AND VERIFIED WITH: Ezekiel Slocumb PharmD 11:55 01/01/19 ([email or web address])    Serratia marcescens NOT DETECTED NOT DETECTED   Carbapenem resistance NOT DETECTED NOT DETECTED   Haemophilus influenzae NOT DETECTED NOT DETECTED   Neisseria meningitidis NOT DETECTED NOT DETECTED   Pseudomonas aeruginosa NOT DETECTED NOT DETECTED   Candida albicans NOT DETECTED NOT DETECTED   Candida glabrata NOT DETECTED NOT DETECTED   Candida krusei NOT DETECTED NOT DETECTED   Candida parapsilosis NOT DETECTED NOT DETECTED   Candida tropicalis NOT DETECTED NOT DETECTED    Comment: Performed at [facility] [address] [place], [place] [address]  POC SARS Coronavirus 2 Ag-ED - Nasal Swab (BD Veritor Kit)     Status: None   Collection Time: 12/31/18  2:53 PM  Result Value Ref Range   SARS Coronavirus 2 Ag NEGATIVE NEGATIVE    Comment: (NOTE) SARS-CoV-2 antigen NOT DETECTED.  Negative results are presumptive.  Negative results do not preclude SARS-CoV-2 infection and should not be used as the sole basis for treatment or other patient management decisions, including infection  control decisions, particularly in the presence of clinical signs and  symptoms consistent with COVID-19, or in those who have been in contact with the virus.  Negative results must be combined with clinical observations, patient history, and epidemiological information. The expected result is Negative. Fact Sheet for Patients: PodPark.tn Fact Sheet for Healthcare Providers: GiftContent.is This test is not yet approved or cleared by the [place] FDA and  has been  authorized for detection and/or diagnosis of SARS-CoV-2 by FDA under an Emergency Use Authorization (EUA).  This EUA will remain in effect (meaning this test can be used) for the duration of  the COVID-19 de claration under Section 564(b)(1) of the Act, 21 U.S.C. section 360bbb-3(b)(1), unless the authorization is terminated or revoked sooner.   SARS CORONAVIRUS 2 (TAT 6-24 HRS) Nasopharyngeal Nasopharyngeal Swab     Status: None   Collection Time: 12/31/18  4:07 PM   Specimen: Nasopharyngeal Swab  Result Value Ref Range   SARS Coronavirus 2 NEGATIVE NEGATIVE    Comment: (NOTE) SARS-CoV-2 target nucleic acids are NOT DETECTED. The SARS-CoV-2 RNA is generally detectable in upper and lower respiratory specimens during the acute phase of infection. Negative results do not preclude SARS-CoV-2 infection, do not rule out co-infections with other pathogens, and should not be used as  the sole basis for treatment or other patient management decisions. Negative results must be combined with clinical observations, patient history, and epidemiological information. The expected result is Negative. Fact Sheet for Patients: SugarRoll.be Fact Sheet for Healthcare Providers: https://www.woods-mathews.com/ This test is not yet approved or cleared by the [place] FDA and  has been authorized for detection and/or diagnosis of SARS-CoV-2 by FDA under an Emergency Use Authorization (EUA). This EUA will remain  in effect (meaning this test can be used) for the duration of the COVID-19 declaration under Section 56 4(b)(1) of the Act, 21 U.S.C. section 360bbb-3(b)(1), unless the authorization is terminated or revoked sooner. Performed at [facility], [place] [address], [place], [place] [address]   Lactic acid, plasma     Status: Abnormal   Collection Time: 12/31/18  4:27 PM  Result Value Ref Range   Lactic Acid, Venous 2.0 (HH) 0.5 - 1.9 mmol/L     Comment: CRITICAL VALUE NOTED.  VALUE IS CONSISTENT WITH PREVIOUSLY REPORTED AND CALLED VALUE. Performed at [facility], [place] [address], [place], [place] [address]   Troponin I (High Sensitivity)     Status: Abnormal   Collection Time: 12/31/18  4:27 PM  Result Value Ref Range   Troponin I (High Sensitivity) 47 (H) <18 ng/L    Comment: (NOTE) Elevated high sensitivity troponin I (hsTnI) values and significant  changes across serial measurements may suggest ACS but many other  chronic and acute conditions are known to elevate hsTnI results.  Refer to the "Links" section for chest pain algorithms and additional  guidance. Performed at [facility], [place] [address], [place], [place] [address]   CBC     Status: Abnormal   Collection Time: 12/31/18  9:52 PM  Result Value Ref Range   WBC 12.1 (H) 4.0 - 10.5 K/uL   RBC 3.84 (L) 3.87 - 5.11 MIL/uL   Hemoglobin 11.6 (L) 12.0 - 15.0 g/dL   HCT 36.8 36.0 - 46.0 %   MCV 95.8 80.0 - 100.0 fL   MCH 30.2 26.0 - 34.0 pg   MCHC 31.5 30.0 - 36.0 g/dL   RDW 14.4 11.5 - 15.5 %   Platelets 270 150 - 400 K/uL   nRBC 0.0 0.0 - 0.2 %    Comment: Performed at [facility], [place] [address], [place], [place] [address]  Creatinine, serum     Status: Abnormal   Collection Time: 12/31/18  9:52 PM  Result Value Ref Range   Creatinine, Ser 1.25 (H) 0.44 - 1.00 mg/dL   GFR calc non Af Amer 38 (L) >60 mL/min   GFR calc Af Amer 44 (L) >60 mL/min    Comment: Performed at [facility] [address], [place], [place] [address]  CK     Status: Abnormal   Collection Time: 01/01/19  4:38 AM  Result Value Ref Range   Total CK 372 (H) 38 - 234 U/L    Comment: Performed at [facility], [place] [address], [place], [place] [address]  Comprehensive metabolic panel     Status: Abnormal   Collection Time: 01/01/19  4:38 AM  Result Value Ref Range   Sodium 154 (H) 135 - 145 mmol/L   Potassium 2.9 (L) 3.5 - 5.1 mmol/L   Chloride 115 (H) 98 -  111 mmol/L   CO2 26 22 - 32 mmol/L   Glucose, Bld 173 (H) 70 - 99 mg/dL   BUN 51 (  H) 8 - 23 mg/dL   Creatinine, Ser 1.20 (H) 0.44 - 1.00 mg/dL   Calcium 9.2 8.9 - 10.3 mg/dL   Total Protein 6.5 6.5 - 8.1 g/dL   Albumin 2.7 (L) 3.5 - 5.0 g/dL   AST 42 (H) 15 - 41 U/L   ALT 30 0 - 44 U/L   Alkaline Phosphatase 57 38 - 126 U/L   Total Bilirubin 0.9 0.3 - 1.2 mg/dL   GFR calc non Af Amer 40 (L) >60 mL/min   GFR calc Af Amer 47 (L) >60 mL/min   Anion gap 13 5 - 15    Comment: Performed at [facility] [address] [place], [place] [address]  Magnesium     Status: None   Collection Time: 01/01/19  4:38 AM  Result Value Ref Range   Magnesium 2.1 1.7 - 2.4 mg/dL    Comment: Performed at [facility] [address] [place], [place] [address]  CBC WITH DIFFERENTIAL     Status: Abnormal   Collection Time: 01/01/19  4:38 AM  Result Value Ref Range   WBC 10.1 4.0 - 10.5 K/uL   RBC 3.87 3.87 - 5.11 MIL/uL   Hemoglobin 11.7 (L) 12.0 - 15.0 g/dL   HCT 37.7 36.0 - 46.0 %   MCV 97.4 80.0 - 100.0 fL   MCH 30.2 26.0 - 34.0 pg   MCHC 31.0 30.0 - 36.0 g/dL   RDW 14.5 11.5 - 15.5 %   Platelets 268 150 - 400 K/uL   nRBC 0.0 0.0 - 0.2 %   Neutrophils Relative % 85 %   Neutro Abs 8.6 (H) 1.7 - 7.7 K/uL   Lymphocytes Relative 6 %   Lymphs Abs 0.6 (L) 0.7 - 4.0 K/uL   Monocytes Relative 9 %   Monocytes Absolute 0.9 0.1 - 1.0 K/uL   Eosinophils Relative 0 %   Eosinophils Absolute 0.0 0.0 - 0.5 K/uL   Basophils Relative 0 %   Basophils Absolute 0.0 0.0 - 0.1 K/uL   Immature Granulocytes 0 %   Abs Immature Granulocytes 0.04 0.00 - 0.07 K/uL    Comment: Performed at [facility], [place] [address], [place], [place] [address]  Basic metabolic panel     Status: Abnormal   Collection Time: 01/01/19  5:45 PM  Result Value Ref Range   Sodium 149 (H) 135 - 145 mmol/L   Potassium 4.5 3.5 - 5.1 mmol/L    Comment: SLIGHT HEMOLYSIS DELTA CHECK NOTED    Chloride 118 (H) 98 - 111  mmol/L   CO2 22 22 - 32 mmol/L   Glucose, Bld 146 (H) 70 - 99 mg/dL   BUN 53 (H) 8 - 23 mg/dL   Creatinine, Ser 1.15 (H) 0.44 - 1.00 mg/dL   Calcium 8.7 (L) 8.9 - 10.3 mg/dL   GFR calc non Af Amer 42 (L) >60 mL/min   GFR calc Af Amer 49 (L) >60 mL/min   Anion gap 9 5 - 15    Comment: Performed at [facility] [address], [place],  [address]   Dg Chest 1 View  Result Date: 12/31/2018 CLINICAL DATA:  Fall EXAM: CHEST  1 VIEW COMPARISON:  2011 FINDINGS: Lungs are clear. Small calcified granuloma overlies the mid left lung. No pleural effusion or pneumothorax. Heart size is normal. There is calcified plaque along the thoracic aorta. IMPRESSION: No acute process in the chest. Electronically Signed   By: Malachi Carl  Patel [name]   On: 12/31/2018 16:18   Dg Ankle Complete Right  Result Date: 12/31/2018 CLINICAL DATA:  Altered mental status, found down, ankle deformity EXAM: RIGHT ANKLE - COMPLETE 3+ VIEW COMPARISON:  Radiograph 07/14/2010 FINDINGS: The osseous structures appear diffusely demineralized which may limit detection of small or nondisplaced fractures. Imaging quality further degraded by suboptimal AP and oblique projections. There is a moderate ankle joint effusion. Circumferential swelling of the ankle is noted as well. No acute fracture or traumatic malalignment is evident. There is likely remote posttraumatic deformity the distal fibula. There has been some articular surface collapse and features of osteonecrosis of talar dome with chronic compression of the talus and calcaneus progressed from prior radiographs. Furthermore there are additional severe degenerative changes involving the mid and hindfoot including a pes planus deformity incompletely assessed on these nondedicated, nonweightbearing radiographs. Extensive vascular calcium noted in the soft tissues few benign soft tissue mineralization is are present. IMPRESSION: 1. Diffuse soft tissue swelling with ankle joint  effusion. No acute fracture or clear traumatic malalignment though evaluation is limited due to severe demineralization, extensive degenerative change, and suboptimal patient positioning. 2. Advanced degenerative changes involving the mid and hindfoot with features of osteonecrosis of the talar dome and a pes planus deformity. Electronically Signed   By: Lovena Le [name]   On: 12/31/2018 16:25   Ct Head Wo Contrast  Result Date: 12/31/2018 CLINICAL DATA:  Altered level of consciousness, unexplained fall, down for 6 days EXAM: CT HEAD WITHOUT CONTRAST TECHNIQUE: Contiguous axial images were obtained from the base of the skull through the vertex without intravenous contrast. COMPARISON:  None. FINDINGS: Brain: No evidence of acute infarction, hemorrhage, hydrocephalus, extra-axial collection or mass lesion/mass effect. Symmetric prominence of the ventricles, cisterns and sulci compatible with parenchymal volume loss. Patchy areas of white matter hypoattenuation are most compatible with chronic microvascular angiopathy. Vascular: Atherosclerotic calcification of the carotid siphons and intradural vertebral arteries. No hyperdense vessel. Skull: No calvarial fracture or suspicious osseous lesion. No scalp swelling or hematoma. Sinuses/Orbits: Paranasal sinuses and mastoid air cells are predominantly clear. Orbital structures are unremarkable aside from prior lens extractions. Other: None IMPRESSION: 1. No acute intracranial abnormality. 2. Moderate parenchymal atrophy and chronic microvascular angiopathy. Electronically Signed   By: Lovena Le [name]   On: 12/31/2018 15:35   Dg Chest Portable 1 View  Result Date: 01/01/2019 CLINICAL DATA:  ? aspiration, concern for aspiration, rule out pneumonia. EXAM: PORTABLE CHEST 1 VIEW COMPARISON:  Chest radiograph 12/31/2018 FINDINGS: Heart size within normal limits. Aortic atherosclerosis. No evidence of airspace consolidation within the lungs. Please note the left  lateral costophrenic angle is not entirely included within the field of view. No pleural effusion or pneumothorax. No acute bony abnormality. Overlying cardiac monitoring leads. IMPRESSION: No airspace consolidation. Electronically Signed   By: Kellie Simmering DO   On: 01/01/2019 07:54   Dg Knee Complete 4 Views Right  Result Date: 12/31/2018 CLINICAL DATA:  Altered mental status, found down for 6 days EXAM: RIGHT KNEE - COMPLETE 4+ VIEW COMPARISON:  None. FINDINGS: Moderate to severe tricompartmental osteoarthrosis. Mild patella [place] is noted. No acute fracture or definite traumatic malalignment. Trace effusion. Vascular calcium noted posterior to the knee. Mild diffuse soft tissue swelling is seen. IMPRESSION: 1. No acute fracture or traumatic malalignment. 2. Moderate to severe tricompartmental osteoarthrosis. 3. Mild patella [place]. 4. Diffuse soft tissue swelling and trace effusion. Electronically Signed   By: Lovena Le [name]   On: 12/31/2018  16:18   Dg Hips Bilat With Pelvis 3-4 Views  Result Date: 12/31/2018 CLINICAL DATA:  Found down EXAM: DG HIP (WITH OR WITHOUT PELVIS) 3-4V BILAT COMPARISON:  None. FINDINGS: The osseous structures appear diffusely demineralized which may limit detection of small or nondisplaced fractures. No evidence of acute fracture or traumatic diastasis of the pelvis. There are features of osteonecrosis involving both femoral heads, more severe on the right than the left with associated bony remodeling of the acetabula. Proximal femora are otherwise intact. Bowel gas pattern is normal. Minimal soft tissue swelling of the right hip. Extensive vascular calcium noted in the pelvis. Sacrum is difficult to fully evaluate given overlying bowel gas and osteopenia. Bowel gas pattern is nonobstructive. IMPRESSION: 1. Findings of osteonecrosis involving both femoral heads, more severe on the right than the left with associated bony remodeling of the acetabula. 2. No acute fracture or  traumatic diastasis. Sacrum difficult to evaluate due to overlying bowel gas and diffuse osseous demineralization. Electronically Signed   By: Lovena Le [name]   On: 12/31/2018 16:20    Pending Labs Unresulted Labs (From admission, onward)    Start     Ordered   01/07/19 0500  Creatinine, serum  (enoxaparin (LOVENOX)    CrCl < 30 ml/min)  Weekly,   R    Comments: while on enoxaparin therapy.    12/31/18 2115   01/02/19 0500  Comprehensive metabolic panel  Daily,   R     01/01/19 1223   01/02/19 0500  CBC  Daily,   R     01/01/19 1223          Vitals/Pain Today's Vitals   01/01/19 1615 01/01/19 1700 01/01/19 1715 01/01/19 1730  BP: 119/60 (!) 134/55 (!) 131/56 137/67  Pulse: 80 85 76 (!) 114  Resp: '15 17 20 15  '$ Temp:      TempSrc:      SpO2: 94% 95% 95% 95%  PainSc:        Isolation Precautions No active isolations  Medications Medications  enoxaparin (LOVENOX) injection 30 mg (30 mg Subcutaneous Given 12/31/18 2126)  sodium chloride flush (NS) 0.9 % injection 3 mL (3 mLs Intravenous Not Given 01/01/19 1203)  acetaminophen (TYLENOL) suppository 650 mg (has no administration in time range)  dextrose 5 % and 0.45 % NaCl with KCl 40 mEq/L infusion ( Intravenous New Bag/Given 01/01/19 1200)  cefTRIAXone (ROCEPHIN) 2 g in sodium chloride 0.9 % 100 mL IVPB (0 g Intravenous Stopped 01/01/19 1320)  sodium chloride 0.9 % bolus 1,000 mL (0 mLs Intravenous Stopped 12/31/18 2254)  potassium chloride 10 mEq in 100 mL IVPB (0 mEq Intravenous Stopped 01/01/19 1140)  collagenase (SANTYL) ointment ( Topical Given 01/01/19 1651)  potassium chloride 10 mEq in 100 mL IVPB (0 mEq Intravenous Stopped 01/01/19 1631)  aspirin suppository 300 mg (300 mg Rectal Given 01/01/19 1222)    Mobility non-ambulatory High fall risk     R Recommendations: See Admitting Provider Note  Report given to:   Additional Notes: N/A

## 2019-01-02 LAB — CBC
HCT: 35.8 % — ABNORMAL LOW (ref 36.0–46.0)
Hemoglobin: 11.2 g/dL — ABNORMAL LOW (ref 12.0–15.0)
MCH: 30 pg (ref 26.0–34.0)
MCHC: 31.3 g/dL (ref 30.0–36.0)
MCV: 96 fL (ref 80.0–100.0)
Platelets: 241 10*3/uL (ref 150–400)
RBC: 3.73 MIL/uL — ABNORMAL LOW (ref 3.87–5.11)
RDW: 14.3 % (ref 11.5–15.5)
WBC: 6.6 10*3/uL (ref 4.0–10.5)
nRBC: 0 % (ref 0.0–0.2)

## 2019-01-02 LAB — COMPREHENSIVE METABOLIC PANEL
ALT: 29 U/L (ref 0–44)
AST: 32 U/L (ref 15–41)
Albumin: 2.5 g/dL — ABNORMAL LOW (ref 3.5–5.0)
Alkaline Phosphatase: 52 U/L (ref 38–126)
Anion gap: 11 (ref 5–15)
BUN: 41 mg/dL — ABNORMAL HIGH (ref 8–23)
CO2: 22 mmol/L (ref 22–32)
Calcium: 9.1 mg/dL (ref 8.9–10.3)
Chloride: 119 mmol/L — ABNORMAL HIGH (ref 98–111)
Creatinine, Ser: 1.05 mg/dL — ABNORMAL HIGH (ref 0.44–1.00)
GFR calc Af Amer: 55 mL/min — ABNORMAL LOW (ref >60)
GFR calc non Af Amer: 47 mL/min — ABNORMAL LOW (ref >60)
Glucose, Bld: 114 mg/dL — ABNORMAL HIGH (ref 70–99)
Potassium: 3.6 mmol/L (ref 3.5–5.1)
Sodium: 152 mmol/L — ABNORMAL HIGH (ref 135–145)
Total Bilirubin: 0.4 mg/dL (ref 0.3–1.2)
Total Protein: 5.9 g/dL — ABNORMAL LOW (ref 6.5–8.1)

## 2019-01-02 MED ORDER — DEXTROSE 5 % IV SOLN
INTRAVENOUS | Status: DC
  Administered 2019-01-02 – 2019-01-03 (×2): via INTRAVENOUS

## 2019-01-02 MED ORDER — OXYBUTYNIN CHLORIDE 5 MG PO TABS
5.0000 mg | ORAL_TABLET | Freq: Three times a day (TID) | ORAL | Status: DC
  Administered 2019-01-02 – 2019-01-08 (×18): 5 mg via ORAL
  Filled 2019-01-02 (×19): qty 1

## 2019-01-02 MED ORDER — METOPROLOL SUCCINATE ER 50 MG PO TB24
50.0000 mg | ORAL_TABLET | Freq: Every day | ORAL | Status: DC
  Administered 2019-01-02 – 2019-01-07 (×6): 50 mg via ORAL
  Filled 2019-01-02 (×7): qty 1

## 2019-01-02 MED ORDER — ENSURE ENLIVE PO LIQD
237.0000 mL | Freq: Two times a day (BID) | ORAL | Status: DC
  Administered 2019-01-02 – 2019-01-08 (×7): 237 mL via ORAL

## 2019-01-02 MED ORDER — ADULT MULTIVITAMIN W/MINERALS CH
1.0000 | ORAL_TABLET | Freq: Every day | ORAL | Status: DC
  Administered 2019-01-02 – 2019-01-08 (×7): 1 via ORAL
  Filled 2019-01-02 (×7): qty 1

## 2019-01-02 MED ORDER — SIMVASTATIN 20 MG PO TABS
20.0000 mg | ORAL_TABLET | Freq: Every day | ORAL | Status: DC
  Administered 2019-01-02 – 2019-01-07 (×6): 20 mg via ORAL
  Filled 2019-01-02 (×6): qty 1

## 2019-01-02 MED ORDER — LISINOPRIL 40 MG PO TABS
40.0000 mg | ORAL_TABLET | Freq: Every day | ORAL | Status: DC
  Administered 2019-01-02: 40 mg via ORAL
  Filled 2019-01-02 (×2): qty 1

## 2019-01-02 MED ORDER — LABETALOL HCL 5 MG/ML IV SOLN: 10.0000 mg | INTRAVENOUS | Status: DC | PRN

## 2019-01-02 NOTE — Consult Note (Addendum)
WOC consult requested for multiple wounds.  This was already performed yesterday; please refer to consult notes for wound assessment and  measurements, and topical treatment orders have been provided for bedside nurses to perform. Please re-consult if further assistance is needed.  Thank-you,  Julien Girt MSN, [place], [place], [place], [place]

## 2019-01-02 NOTE — Progress Notes (Addendum)
Initial Nutrition Assessment  DOCUMENTATION CODES:   Non-severe (moderate) malnutrition in context of chronic illness  INTERVENTION:   -Ensure Enlive po BID, each supplement provides 350 kcal and 20 grams of protein -Magic Cup po TID, each supplement provides 290 kcal and 9 grams of protein -MVI with minerals -Order new weight  NUTRITION DIAGNOSIS:   Moderate Malnutrition related to chronic illness(advanced age) as evidenced by moderate muscle depletion, moderate fat depletion, severe muscle depletion.  GOAL:   Patient will meet greater than or equal to 90% of their needs  MONITOR:   PO intake, Supplement acceptance, Labs, Weight trends  REASON FOR ASSESSMENT:   Consult Assessment of nutrition requirement/status  ASSESSMENT:   85 year old pt admitted with confusion after found down on the floor in urine and feces, down for 6 days. PMH of HTN, Takotsubo cardiomyopathy with systolic CHF, HTN and HLD. Pt found to have dehydration, unstageable midthoracic and deep tissue pressure injuries to sacrum and right lateral foot.  Pt was in bed eating oatmeal with son, Dominica Severin, at bedside. Son lives in [place] and pts other children bring her groceries and check in on her. Pt is a poor historian. Son reported pt has short term memory loss/dementia. Pt reported staying in bed until Meals on Wheels is delivered. Reports eating 100% of meals and attributes weight loss to being served small portions. During the weekend, when Meals on Wheels is not delivered, pt reported laying in bed until after 11 am and "making something" later. Pt does not drink any supplements at home. Reported drinking lots of water throughout the day.   Pt is willing to try all flavors of Ensure Enlive. Pt likes orange and chocolate Magic Cups.   Pt uses walker at home and reports cooking, cleaning and washing laundry. Pt has vertical ridges in nails possibly related to PCM. Pt reported using her hands a lot and it being  normal.   Pt is unsure of current wt. Pt reports losing 100# over 4 years. Son reports pts husband died 4 years ago and pts wt has been declining since. Chart reflects pt consuming 0% of meals. Pt currently meets standards for moderate malnutrition but suspect pt is severely malnourished. Order new wt.   Wound care consulted and found unstageable mid-thoracic ulcer and deep tissue pressure injuries of sacrum and right lateral mid and hindfoot.   Speech/Language following and recommended dysphagia 3 with thin liquids.  Medications: rocephin   Labs: sodium 152 (H), glucose 114   NUTRITION - FOCUSED PHYSICAL EXAM:    Most Recent Value  Orbital Region  Mild depletion  Upper Arm Region  Moderate depletion  Thoracic and Lumbar Region  Moderate depletion  Buccal Region  No depletion  Temple Region  Severe depletion  Clavicle Bone Region  Severe depletion  Clavicle and Acromion Bone Region  Severe depletion  Scapular Bone Region  Severe depletion  Dorsal Hand  Severe depletion  Patellar Region  Moderate depletion  Anterior Thigh Region  Moderate depletion  Posterior Calf Region  Moderate depletion  Edema (RD Assessment)  Unable to assess  Hair  Reviewed  Eyes  Reviewed  Mouth  Reviewed  Skin  Reviewed  Nails  Reviewed       Diet Order:   Diet Order            DIET DYS 3 Room service appropriate? Yes; Fluid consistency: Thin  Diet effective now  EDUCATION NEEDS:   Not appropriate for education at this time  Skin:  Skin Assessment: Reviewed RN Assessment(unstageable mid-thoracic, deep tissue pressure injuries to sacrum and right lateral mid and hindfoot)  Last BM:  unknown  Height:   Ht Readings from Last 1 Encounters:  08/29/11 4\' 9"  (1.448 m)    Weight:   Wt Readings from Last 1 Encounters:  08/29/11 62.1 kg    Ideal Body Weight:  43.2 kg  BMI:  There is no height or weight on file to calculate BMI.  Estimated Nutritional Needs:   Kcal:   1300-1500  Protein:  65-75  Fluid:  >1.3L    10/29/11 Dietetic Intern Pager # [phone]

## 2019-01-02 NOTE — Evaluation (Signed)
Clinical/Bedside Swallow Evaluation Patient Details  Name: Emily Camacho MRN: [id number] Date of Birth: [birth year]  Today's Date: 01/02/2019 Time: SLP Start Time (ACUTE ONLY): 0845 SLP Stop Time (ACUTE ONLY): 0913 SLP Time Calculation (min) (ACUTE ONLY): 28 min  Past Medical History:  Past Medical History:  Diagnosis Date  . Arthritis   . Cardiomyopathy    tako tsubo  . Dyslipidemia   . Epistaxis   . GERD (gastroesophageal reflux disease)   . HTN (hypertension)   . Renal disorder    Past Surgical History:  Past Surgical History:  Procedure Laterality Date  . CATARACT EXTRACTION     Bilateral eyes  . KNEE SURGERY  90's  . TOE SURGERY    . TOTAL ABDOMINAL HYSTERECTOMY W/ BILATERAL SALPINGOOPHORECTOMY     HPI:  Ms Emily Camacho, 89y/f, found down on floor covered in feces and urine, down for 6 days with pressure ulcers prior to admission state 11 upper back unstagable sacrum, severe AKI, AMS, PMH of arthritis, cardiomyopathy, dyslipidemia, epistaxis, GERD, HTN. No prior known history of swallowing problems.     Assessment / Plan / Recommendation Clinical Impression  Patient seen for clinical swallow evaluation and found to have mild oropharyngeal dysphagia likely related to deconditioning in setting of acute illness. Patient oriented X 4 and able to follow all commands for evaluation, however still confused about events leading up to this hospitalization.  She presented with prolonged mastication with solids (especially dry solids), reduced oral bolus control with larger sips and suspect delayed swallow initiation. Recommend Dys 3 diet with thin liquids, medication whole in puree. Staff to assist with meal set up assuring pt sit at 90 degress in bed or chair for meals, monitor for small sips and bites. Speech Therapy to follow for aspiration precaution training and diet tolerance.  SLP Visit Diagnosis: Dysphagia, oropharyngeal phase (R13.12)    Aspiration Risk  Mild aspiration  risk    Diet Recommendation Dysphagia 3 (Mech soft);Thin liquid   Liquid Administration via: Cup;Straw(small sips) Medication Administration: Whole meds with puree Compensations: Minimize environmental distractions;Slow rate;Small sips/bites Postural Changes: Seated upright at 90 degrees;Remain upright for at least 30 minutes after po intake    Other  Recommendations Oral Care Recommendations: Oral care BID           Frequency and Duration min 1 x/week  1 week       Prognosis Prognosis for Safe Diet Advancement: Good      Swallow Study   General Date of Onset: 12/31/18 HPI: Ms Emily Camacho, 89y/f, found down on floor covered in feces and urine, down for 6 days with pressure ulcers prior to admission state 11 upper back unstagable sacrum, severe AKI, AMS, PMH of arthritis, cardiomyopathy, dyslipidemia, epistaxis, GERD, HTN. No prior known history of swallowing problems.   Type of Study: Bedside Swallow Evaluation Previous Swallow Assessment: none Diet Prior to this Study: Dysphagia 3 (soft);Thin liquids Temperature Spikes Noted: No Respiratory Status: Room air History of Recent Intubation: No Behavior/Cognition: Alert;Cooperative;Confused Oral Cavity Assessment: Within Functional Limits Oral Care Completed by SLP: Yes Oral Cavity - Dentition: Adequate natural dentition;Missing dentition Vision: Functional for self-feeding Self-Feeding Abilities: Able to feed self;Needs set up Patient Positioning: Upright in bed Baseline Vocal Quality: Normal Volitional Cough: Weak Volitional Swallow: Able to elicit    Oral/Motor/Sensory Function Overall Oral Motor/Sensory Function: Generalized oral weakness Facial Symmetry: Within Functional Limits Facial Strength: Within Functional Limits Facial Sensation: Within Functional Limits Lingual ROM: Within Functional Limits Lingual Symmetry: Within  Functional Limits Lingual Strength: Within Functional Limits Lingual Sensation: Within  Functional Limits Velum: Within Functional Limits Mandible: Within Functional Limits   Ice Chips Ice chips: Within functional limits Presentation: Spoon   Thin Liquid Thin Liquid: Impaired Presentation: Cup;Straw Pharyngeal  Phase Impairments: Throat Clearing - Immediate;Wet Vocal Quality(with large sips only)    Nectar Thick Nectar Thick Liquid: Not tested   Honey Thick Honey Thick Liquid: Not tested   Puree Puree: Within functional limits   Solid     Solid: Impaired Oral Phase Functional Implications: (prolonged mastication)      Wynelle Bourgeois., MA, CCC-SLP 01/02/2019,9:29 AM
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
## 2019-01-02 NOTE — Evaluation (Signed)
Occupational Therapy Evaluation Patient Details Name: Emily Camacho MRN: [id number] DOB: 28-Aug-1928 Today's Date: 01/02/2019    History of Present Illness 89 WF HTN, Takotsubo cardiomyopathy with systolic heart failure on cath 08/14/2009, HTN, HLD, prior cirrhosis, prior DVT (details unknown), and bladder prolapse status post repair 2003.  Details unknown-found down on floor covered in feces, urine, and cat food. She was on ground for possibly 6 days. Found with pressure ulcers on back and sacrum.   Clinical Impression   Patient is an 85 year old female that typically lives alone with her 2 cats, mod I with self care and functional transfers using rolling walker. Patient has sons that live nearby and provide transportation assist, patient also has MOW. Patient currently has poor insight to her deficits and is a poor historian believes that she was found down in [place] furniture shopping with her DTR when in fact she was found down at home. Patient with weakness, cognitive impairments and would further benefit from acute OT services as she was mod I at baseline.      Follow Up Recommendations  SNF;Supervision/Assistance - 24 hour    Equipment Recommendations  Other (comment)(TBD)       Precautions / Restrictions Precautions Precautions: Fall Restrictions Weight Bearing Restrictions: No      Mobility Bed Mobility               General bed mobility comments: patient refusing mobility at this time despite education/encouragement "I just don't feel like it"  Transfers Overall transfer level: Needs assistance               General transfer comment: unable to perform, patient refusing    Balance Overall balance assessment: Needs assistance     Sitting balance - Comments: patient refusing mobility                                   ADL either performed or assessed with clinical judgement   ADL Overall ADL's : Needs assistance/impaired Eating/Feeding:  Set up;Bed level Eating/Feeding Details (indicate cue type and reason): patient coughing with ice chips, spoke with speech therapist about this Grooming: Wash/dry face;Set up;Bed level                                 General ADL Comments: patient refusing any mobility "I don't feel like it"     Vision   Additional Comments: patient reports difficulty with vision            Pertinent Vitals/Pain Pain Assessment: No/denies pain Pain Intervention(s): Other (comment)(refused mobility)     Hand Dominance Right   Extremity/Trunk Assessment Upper Extremity Assessment Upper Extremity Assessment: Generalized weakness   Lower Extremity Assessment Lower Extremity Assessment: Defer to PT evaluation       Communication Communication Communication: No difficulties   Cognition Arousal/Alertness: Awake/alert Behavior During Therapy: WFL for tasks assessed/performed Overall Cognitive Status: Impaired/Different from baseline Area of Impairment: Orientation;Memory;Safety/judgement;Awareness                 Orientation Level: Disoriented to;Situation;Time   Memory: Decreased short-term memory   Safety/Judgement: Decreased awareness of deficits Awareness: Intellectual   General Comments: Poor historian and insight into her deficits despite OT providing extensive education + redirection      Exercises Exercises: General Upper Extremity General Exercises - Upper Extremity Shoulder Flexion: 5  reps;Both;AAROM;Supine Elbow Flexion: AAROM;10 reps;Both;Supine Elbow Extension: AAROM;10 reps;Supine;Both Digit Composite Flexion: 5 reps;AROM;Both;Supine   Shoulder Instructions      Home Living Family/patient expects to be discharged to:: Private residence Living Arrangements: Alone Available Help at Discharge: Family;Friend(s);Neighbor;Available PRN/intermittently Type of Home: House Home Access: Ramped entrance     Home Layout: One level     Bathroom  Shower/Tub: Producer, television/film/video: Standard     Home Equipment: Environmental consultant - 2 wheels;Shower seat;Grab bars - tub/shower          Prior Functioning/Environment Level of Independence: Independent with assistive device(s)        Comments: receives meals on wheels, patient does not drive, sons provide transport or patient takes the bus        OT Problem List: Decreased strength;Decreased activity tolerance;Impaired balance (sitting and/or standing);Decreased cognition;Decreased safety awareness      OT Treatment/Interventions: Self-care/ADL training;Therapeutic exercise;DME and/or AE instruction;Therapeutic activities;Cognitive remediation/compensation;Patient/family education;Balance training    OT Goals(Current goals can be found in the care plan section) Acute Rehab OT Goals Patient Stated Goal: to go home to feed her cats OT Goal Formulation: With patient Time For Goal Achievement: 01/16/19 Potential to Achieve Goals: Good  OT Frequency: Min 2X/week    AM-PAC OT "6 Clicks" Daily Activity     Outcome Measure Help from another person eating meals?: A Little Help from another person taking care of personal grooming?: A Little Help from another person toileting, which includes using toliet, bedpan, or urinal?: Total Help from another person bathing (including washing, rinsing, drying)?: Total Help from another person to put on and taking off regular upper body clothing?: Total Help from another person to put on and taking off regular lower body clothing?: Total 6 Click Score: 10   End of Session Nurse Communication: Other (comment)(pt refusing mobility)  Activity Tolerance: Patient limited by fatigue Patient left: in bed;with call bell/phone within reach;with bed alarm set  OT Visit Diagnosis: Other abnormalities of gait and mobility (R26.89);Muscle weakness (generalized) (M62.81);History of falling (Z91.81);Other symptoms and signs involving cognitive function                 Time: 7846-9629 OT Time Calculation (min): 23 min Charges:  OT General Charges $OT Visit: 1 Visit OT Evaluation $OT Eval Moderate Complexity: 1 Mod OT Treatments $Therapeutic Exercise: 8-22 mins  Myrtie Neither OT OT office: [phone]  Carmelia Roller 01/02/2019, 1:35 PM

## 2019-01-03 ENCOUNTER — Encounter (HOSPITAL_COMMUNITY): Payer: Self-pay | Admitting: Family Medicine

## 2019-01-03 DIAGNOSIS — E440 Moderate protein-calorie malnutrition: Secondary | ICD-10-CM | POA: Insufficient documentation

## 2019-01-03 DIAGNOSIS — Z515 Encounter for palliative care: Secondary | ICD-10-CM

## 2019-01-03 DIAGNOSIS — Z7189 Other specified counseling: Secondary | ICD-10-CM

## 2019-01-03 DIAGNOSIS — A498 Other bacterial infections of unspecified site: Secondary | ICD-10-CM

## 2019-01-03 DIAGNOSIS — W19XXXA Unspecified fall, initial encounter: Secondary | ICD-10-CM

## 2019-01-03 DIAGNOSIS — N179 Acute kidney failure, unspecified: Secondary | ICD-10-CM

## 2019-01-03 LAB — CBC
HCT: 30.4 % — ABNORMAL LOW (ref 36.0–46.0)
Hemoglobin: 9.8 g/dL — ABNORMAL LOW (ref 12.0–15.0)
MCH: 29.7 pg (ref 26.0–34.0)
MCHC: 32.2 g/dL (ref 30.0–36.0)
MCV: 92.1 fL (ref 80.0–100.0)
Platelets: 192 10*3/uL (ref 150–400)
RBC: 3.3 MIL/uL — ABNORMAL LOW (ref 3.87–5.11)
RDW: 13.8 % (ref 11.5–15.5)
WBC: 8.6 10*3/uL (ref 4.0–10.5)
nRBC: 0 % (ref 0.0–0.2)

## 2019-01-03 LAB — COMPREHENSIVE METABOLIC PANEL
ALT: 27 U/L (ref 0–44)
AST: 30 U/L (ref 15–41)
Albumin: 2.1 g/dL — ABNORMAL LOW (ref 3.5–5.0)
Alkaline Phosphatase: 46 U/L (ref 38–126)
Anion gap: 9 (ref 5–15)
BUN: 32 mg/dL — ABNORMAL HIGH (ref 8–23)
CO2: 22 mmol/L (ref 22–32)
Calcium: 8.4 mg/dL — ABNORMAL LOW (ref 8.9–10.3)
Chloride: 111 mmol/L (ref 98–111)
Creatinine, Ser: 1.17 mg/dL — ABNORMAL HIGH (ref 0.44–1.00)
GFR calc Af Amer: 48 mL/min — ABNORMAL LOW (ref >60)
GFR calc non Af Amer: 42 mL/min — ABNORMAL LOW (ref >60)
Glucose, Bld: 139 mg/dL — ABNORMAL HIGH (ref 70–99)
Potassium: 3.4 mmol/L — ABNORMAL LOW (ref 3.5–5.1)
Sodium: 142 mmol/L (ref 135–145)
Total Bilirubin: 0.6 mg/dL (ref 0.3–1.2)
Total Protein: 5.1 g/dL — ABNORMAL LOW (ref 6.5–8.1)

## 2019-01-03 LAB — CULTURE, BLOOD (ROUTINE X 2)
Special Requests: ADEQUATE
Special Requests: ADEQUATE

## 2019-01-03 MED ORDER — SODIUM CHLORIDE 0.9 % IV SOLN
INTRAVENOUS | Status: DC | PRN
  Administered 2019-01-03 – 2019-01-06 (×3): 250 mL via INTRAVENOUS

## 2019-01-03 MED ORDER — POTASSIUM CHLORIDE 20 MEQ/15ML (10%) PO SOLN
20.0000 meq | Freq: Once | ORAL | Status: AC
  Administered 2019-01-03: 20 meq
  Filled 2019-01-03: qty 15

## 2019-01-03 MED ORDER — CEFAZOLIN SODIUM-DEXTROSE 2-4 GM/100ML-% IV SOLN
2.0000 g | Freq: Two times a day (BID) | INTRAVENOUS | Status: DC
  Administered 2019-01-03 – 2019-01-07 (×9): 2 g via INTRAVENOUS
  Filled 2019-01-03 (×11): qty 100

## 2019-01-03 MED ORDER — LISINOPRIL 20 MG PO TABS
20.0000 mg | ORAL_TABLET | Freq: Every day | ORAL | Status: DC
  Administered 2019-01-04 – 2019-01-08 (×5): 20 mg via ORAL
  Filled 2019-01-03 (×5): qty 1

## 2019-01-03 NOTE — Consult Note (Addendum)
Consultation Note Date: 01/03/2019   Patient Name: Emily Camacho  DOB: 01/28/1928  MRN: [id number]  Age / Sex: 85 y.o., female  PCP: Emily ShiverMasneri, Shannon M, DO Referring Physician: Lanae BoastKc, Ramesh, MD  Reason for Consultation: Establishing goals of care and Psychosocial/spiritual support  HPI/Patient Profile: 85 y.o. female  with past medical history of cardiomyopathy -Takatbubo, 20 lb weight loss, GERD, hypertension, dyslipidemia, arthritis, knee surgery, toe surgery, admitted on 12/31/2018 with acute dehydration with metabolic acidosis, acute renal failure, hypernatremia, rhabdomyolysis with wounds related to being on the floor for multiple days.   Clinical Assessment and Goals of Care: Emily Camacho is lying quietly in bed. She greets me making and somewhat keeping eye contact. When we first speak she closes her eyes, but will keep her eyes open when I request. She is alert, oriented x3, but continues to Microsoftmis-state facts. As an example she tells me that she fell in a store in [place], in another instance she tells me that her cats followed her as she walked to [place]. She is able to make her basic needs known. There is no family at bedside at this time.  We talked about her acute health concerns. Although Emily Camacho is oriented x3, she is confused about her circumstances related to this hospitalization. We talked about going to rehab for wound care and rehab. Initially she states that she would go to rehab (no choice of facility), but, later in the conversation she tells me "my back does not feel bad" when we talk about her need for wound care.  Emily Camacho tells me that she would like home health services instead of rehab.  It is clear that she is unable to care for herself safely at home.  We talked about healthcare power of attorney. Emily Camacho tells me that her youngest son Emily Camacho helps her with yard work, and she  believes he is feeding her cats. She tells me that she has 4 children, that Emily Camacho has 3 brothers. We talked about her daughter, Emily Camacho who lives in [place].  Again, Emily Camacho is oriented x3, but confused related to family dynamics. She confabulates frequently.  Call to son, Emily Camacho at [phone], left voicemail message. Call to daughter, Emily Camacho at [phone], left voicemail message.  Conference with bedside nursing staff, attending, social work related to patient condition, needs, disposition.  ADDENDUM:   Page from bedside nursing staff that daughter is at bedside.   Family meeting with daughter Emily Camacho. Emily Camacho is alert and oriented, but continues to confabulate stories related to her current illness.  Emily Camacho endorses mental status decline over the last year, telling me that she stated with her mother for a few weeks in January for cataract surgery, and returned a few weeks later for the second surgery and Emily Camacho had forgotten what had happened.  Emily Camacho also tells me that Emily Camacho does not have cats, she "looked all over the house" in January and there are no cats.   Emily Camacho  tells me that her brothers Emily Camacho and Emily Camacho go to Emily Camacho home every 2 weeks to handles groceries, yard work and garbage chores.  She has her meds delivered in blister packs, and Emily Camacho believs she is taking them properly.   We talk about Emily Camacho acute and chronic health concerns.  It is clear that Emily Camacho is not aware of her mother'Camacho chronic illness burden (sharing that her mother is reluctant to talk).   We talk about a safe discharge plan. I share that Emily Camacho wants to go [facility] with [facility], but at this point, she is unsafe.  Emily Camacho states she can be with her mother, "but only for 1 week".  I share that she would likely qualify for rehab and the medical team would like for family to encourage ST rehab.  We also talk about CODE STATUS.  When I talk about life support, Emily Camacho talks about a card being given by  cataract surgeon that medical team should be aware that she has a lens before shocking her.  I discuss "treat the treatable" care.   Emily Camacho goes on tangential discussions often, and I am not sure that she can accurately relay information to herr brothers.   HCPOA:  Emily Camacho tells me that one of her brothers, Emily Camacho or Emily Camacho, would be a better choice for decision making.  I encourage Emily Camacho to have family discussions over the next few day, encouraging Emily Camacho to go to rehab, or making arrangements to care for her at home AND to have code status discussions.   Emily Camacho is oriented X 3, but is clearly confused (at this point), saying her cats got on the bus to [place] with her.  Daughter Emily'Camacho response to this was to state "it doesn't matter if the cats got on the bus, you aren't supposed to get on a bus" when it is clear that Emily Camacho was never on a bus.  Emily Camacho states several times that she believes that Emily Camacho is saying these things because she "had a dream".    Conference with bedside nursing staff and SW relating to family visit.    HCPOA     NEXT OF KIN -Emily Camacho tells me her youngest son, Emily Camacho, would be her surrogate decision-maker.  She also tells me that he has "3 brothers".  I am not sure that this is correct.  She has a daughter, Emily Camacho.  She tells me that her spouse died around 5 years ago.    SUMMARY OF RECOMMENDATIONS   At this point continue full scope/full code as per Emily Camacho'Camacho request.  She is unable to set limits on care, but clearly does not have capacity at this point.  Unable to reach family.  Code Status/Advance Care Planning:  Full code - Encouraged family to have code status discussions.   Symptom Management:   Per hospitalist, no additional needs at this time.  Palliative Prophylaxis:   Frequent Pain Assessment, Palliative Wound Care and Turn Reposition  Additional Recommendations (Limitations, Scope, Preferences):  Full Scope  Treatment  Psycho-social/Spiritual:   Desire for further Chaplaincy support:no  Additional Recommendations: Caregiving  Support/Resources and Education on Hospice  Prognosis:   Unable to determine, based on outcomes. 6 months or less would not be surprising based on decreasing functional status, frailty, low albumin.  Discharge Planning: To be determined, based on outcomes.  She is requesting home health.     Primary Diagnoses: Present on Admission: . Dehydration   I have  reviewed the medical record, interviewed the patient and family, and examined the patient. The following aspects are pertinent.  Past Medical History:  Diagnosis Date  . Arthritis   . Cardiomyopathy    tako tsubo  . Dyslipidemia   . Epistaxis   . GERD (gastroesophageal reflux disease)   . HTN (hypertension)   . Renal disorder    Social History   Socioeconomic History  . Marital status: Married    Spouse name: Not on file  . Number of children: Not on file  . Years of education: Not on file  . Highest education level: Not on file  Occupational History  . Not on file  Tobacco Use  . Smoking status: Never Smoker  . Smokeless tobacco: Never Used  Substance and Sexual Activity  . Alcohol use: No  . Drug use: No  . Sexual activity: Not on file  Other Topics Concern  . Not on file  Social History Narrative  . Not on file   Social Determinants of Health   Financial Resource Strain:   . Difficulty of Paying Living Expenses: Not on file  Food Insecurity:   . Worried About Programme researcher, broadcasting/film/videounning Out of Food in the Last Year: Not on file  . Ran Out of Food in the Last Year: Not on file  Transportation Needs:   . Lack of Transportation (Medical): Not on file  . Lack of Transportation (Non-Medical): Not on file  Physical Activity:   . Days of Exercise per Week: Not on file  . Minutes of Exercise per Session: Not on file  Stress:   . Feeling of Stress : Not on file  Social Connections:   . Frequency of  Communication with Friends and Family: Not on file  . Frequency of Social Gatherings with Friends and Family: Not on file  . Attends Religious Services: Not on file  . Active Member of Clubs or Organizations: Not on file  . Attends BankerClub or Organization Meetings: Not on file  . Marital Status: Not on file   Family History  Problem Relation Age of Onset  . Cancer Mother        colon   Scheduled Meds: . enoxaparin (LOVENOX) injection  30 mg Subcutaneous Q24H  . feeding supplement (ENSURE ENLIVE)  237 mL Oral BID BM  . [START ON 01/04/2019] lisinopril  20 mg Oral Daily  . metoprolol succinate  50 mg Oral Daily  . multivitamin with minerals  1 tablet Oral Daily  . oxybutynin  5 mg Oral TID  . simvastatin  20 mg Oral QHS  . sodium chloride flush  3 mL Intravenous Q12H   Continuous Infusions: .  ceFAZolin (ANCEF) IV 2 g (01/03/19 0952)  . dextrose 50 mL/hr at 01/03/19 0645   PRN Meds:.acetaminophen, labetalol Medications Prior to Admission:  Prior to Admission medications   Medication Sig Start Date End Date Taking? Authorizing Provider  aspirin 325 MG tablet Take 325 mg by mouth daily.     Yes [provider]  Calcium 45 MG CAPS daily    Yes [provider]  Cholecalciferol (VITAMIN Camacho) 400 UNITS capsule Take 400 Units by mouth 2 (two) times daily.     Yes [provider]  furosemide (LASIX) 20 MG tablet Take 1 tablet (20 mg total) by mouth daily. 06/24/12  Yes Lewayne Buntingrenshaw, Brian S, MD  lisinopril (PRINIVIL,ZESTRIL) 40 MG tablet Take 1 tablet (40 mg total) by mouth daily. 08/29/11  Yes Lewayne Buntingrenshaw, Brian S, MD  metoprolol succinate (  TOPROL-XL) 25 MG 24 hr tablet Take 50 mg by mouth daily. 10/05/10  Yes Lewayne Bunting, MD  Multiple Vitamins-Minerals (CENTRUM PO) 1 tab po qd    Yes [provider]  oxybutynin (DITROPAN) 5 MG tablet Take 5 mg by mouth 3 (three) times daily.     Yes [provider]  simvastatin (ZOCOR) 20 MG tablet Take 20 mg by mouth  at bedtime.     Yes [provider]  spironolactone (ALDACTONE) 25 MG tablet Take 25 mg by mouth daily.   Yes [provider]   Allergies  Allergen Reactions  . Penicillins     Did it involve swelling of the face/tongue/throat, SOB, or low BP? no Did it involve sudden or severe rash/hives, skin peeling, or any reaction on the inside of your mouth or nose? Yes  Did you need to seek medical attention at a hospital or doctor'Camacho office? N/A When did it last happen?unknown If all above answers are "NO", may proceed with cephalosporin use.  . Sulfonamide Derivatives   . Latex Rash   Review of Systems  Unable to perform ROS: Age    Physical Exam Vitals and nursing note reviewed.  Constitutional:      General: She is not in acute distress.    Comments: Will make and somewhat keep eye contact.   HENT:     Head: Normocephalic and atraumatic.  Cardiovascular:     Rate and Rhythm: Normal rate.  Pulmonary:     Effort: Pulmonary effort is normal. No respiratory distress.  Musculoskeletal:        General: No swelling.  Skin:    General: Skin is warm and dry.     Comments: Wounds as noted by nursing   Neurological:     Mental Status: She is alert and oriented to person, place, and time.     Comments: Oriented, but no capacity.  Continues to incorrectly state events  Psychiatric:     Comments: Calm and cooperative, not fearful      Vital Signs: BP (!) 98/46 (BP Location: Right Arm)   Pulse 77   Temp 98.5 F (36.9 C) (Oral)   Resp 16   Wt 51.2 kg   SpO2 95%   BMI 24.41 kg/Camacho  Pain Scale: 0-10   Pain Score: 0-No pain   SpO2: SpO2: 95 % O2 Device:SpO2: 95 % O2 Flow Rate: .   IO: Intake/output summary:   Intake/Output Summary (Last 24 hours) at 01/03/2019 1038 Last data filed at 01/03/2019 0900 Gross per 24 hour  Intake 1779.97 ml  Output 600 ml  Net 1179.97 ml    LBM: Last BM Date: (PTA) Baseline Weight: Weight: 51.2 kg Most recent weight: Weight:  51.2 kg     Palliative Assessment/Data:   Flowsheet Rows     Most Recent Value  Intake Tab  Referral Department  Hospitalist  Unit at Time of Referral  Cardiac/Telemetry Unit  Palliative Care Primary Diagnosis  Trauma  Date Notified  01/01/19  Palliative Care Type  New Palliative care  Reason for referral  Clarify Goals of Care, Advance Care Planning  Date of Admission  12/31/18  Date first seen by Palliative Care  01/03/19  # of days Palliative referral response time  2 Day(Camacho)  # of days IP prior to Palliative referral  1  Clinical Assessment  Palliative Performance Scale Score  30%  Pain Max last 24 hours  Not able to report  Pain Min Last 24 hours  Not able to report  Dyspnea Max Last 24 Hours  Not able to report  Dyspnea Min Last 24 hours  Not able to report  Psychosocial & Spiritual Assessment  Palliative Care Outcomes      Time In::  1010      1500 Time Out: 8250      0370  Time Total: 75 minutes + 45 minutes extended time  Greater than 50%  of this time was spent counseling and coordinating care related to the above assessment and plan.  Signed by: Drue Novel, NP   Please contact Palliative Medicine Team phone at [phone] for questions and concerns.  For individual provider: See Shea Evans

## 2019-01-03 NOTE — Progress Notes (Signed)
  Speech Language Pathology Treatment: Dysphagia  Patient Details Name: Emily Camacho MRN: [id number] DOB: July 25, 1928 Today's Date: 01/03/2019 Time: 1660-6301 SLP Time Calculation (min) (ACUTE ONLY): 14 min  Assessment / Plan / Recommendation Clinical Impression  Pt was seen for skilled ST targeting dysphagia goals.  Pt was awake and alert upon therapist's arrival.  She reports that she had just finished breakfast.  She denies difficulty with meals and was agreeable to additional PO trials with SLP to work towards diet advancement.  Pt consumed regular textures and thin liquids with no overt s/s of aspiration with either solid or liquid consistencies.  Pt's oral phase was efficient for containment, mastication, and clearance of advanced solids from the oral cavity.  Pt subjectively feels she can tolerate advanced solids and expressed interest in a diet upgrade to regular textures.  I would suggest x1 additional ST session to assess toleration of diet upgrade given her overall debility and reports of fluctuating mental state (clear mentation noted during today's treatment session).  Pt was left in bed with bed with call bell within reach.  Continue per current plan of care.     HPI HPI: Emily Camacho, 89y/f, found down on floor covered in feces and urine, down for 6 days with pressure ulcers prior to admission state 11 upper back unstagable sacrum, severe AKI, AMS, PMH of arthritis, cardiomyopathy, dyslipidemia, epistaxis, GERD, HTN. No prior known history of swallowing problems.        SLP Plan  Continue with current plan of care       Recommendations  Diet recommendations: Regular;Thin liquid Liquids provided via: Cup;Straw Medication Administration: Whole meds with puree Supervision: Patient able to self feed;Intermittent supervision to cue for compensatory strategies Compensations: Minimize environmental distractions;Slow rate;Small sips/bites Postural Changes and/or Swallow  Maneuvers: Seated upright 90 degrees;Upright 30-60 min after meal                Oral Care Recommendations: Oral care BID Follow up Recommendations: None SLP Visit Diagnosis: Dysphagia, unspecified (R13.10) Plan: Continue with current plan of care       GO                Emily Camacho, Emily Camacho 01/03/2019, 9:15 AM
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
## 2019-01-03 NOTE — Care Management Important Message (Signed)
Important Message  Patient Details  Name: Emily Camacho MRN: [id number] Date of Birth: 05-25-1928   Medicare Important Message Given:  Yes     Duan Scharnhorst Montine Circle 01/03/2019, 2:37 PM

## 2019-01-03 NOTE — Progress Notes (Signed)
Physical Therapy Treatment Patient Details Name: Emily Camacho MRN: [id number] DOB: Feb 15, 1928 Today's Date: 01/03/2019    History of Present Illness 89 WF HTN, Takotsubo cardiomyopathy with systolic heart failure on cath 08/14/2009, HTN, HLD, prior cirrhosis, prior DVT (details unknown), and bladder prolapse status post repair 2003.  Details unknown-found down on floor covered in feces, urine, and cat food. She was on ground for possibly 6 days. Found with pressure ulcers on back and sacrum.    PT Comments    Patient received in bed, eyes closed, but awake. Able to tell me where she is, what month and year it is. Reports pain in B LEs but Left > Right. Patient screaming when I attempt to move her left leg. Pain improves slightly with increased movement ( exercises). Patient is not very cooperative about mobility.Requires total assist with all bed mobility at this time. Reports she wants to return home. Educated her that she will need to perform mobility in order to have the possibility of going home.  Patient will continue to benefit from skilled PT while here if cooperative to improve strength and functional mobility.        Follow Up Recommendations  SNF     Equipment Recommendations  None recommended by PT    Recommendations for Other Services       Precautions / Restrictions Precautions Precautions: Fall Restrictions Weight Bearing Restrictions: No    Mobility  Bed Mobility Overal bed mobility: Needs Assistance Bed Mobility: Rolling Rolling: Total assist         General bed mobility comments: refusing much mobility. Screaming when I touch her legs expecially her left leg. Despite encouragement, patient is unwilling to participate  Transfers                 General transfer comment: unable to perform, patient refusing  Ambulation/Gait             General Gait Details: unable/unwilling due to reported pain   Stairs             Wheelchair  Mobility    Modified Rankin (Stroke Patients Only)       Balance Overall balance assessment: Needs assistance   Sitting balance-Leahy Scale: Zero                                      Cognition Arousal/Alertness: Awake/alert;Lethargic Behavior During Therapy: WFL for tasks assessed/performed Overall Cognitive Status: No family/caregiver present to determine baseline cognitive functioning Area of Impairment: Problem solving;Following commands;Safety/judgement                 Orientation Level: Disoriented to;Situation     Following Commands: Follows one step commands with increased time Safety/Judgement: Decreased awareness of deficits Awareness: Intellectual Problem Solving: Decreased initiation;Requires verbal cues General Comments: Patient is alert and oriented, however thinks she will be able to return home. She is unaware of deficits      Exercises Other Exercises Other Exercises: minimal LE exercises as patient would barely move and reports pain: AP, heel slides, hip abd with assist x 5 reps each bilaterally. Patient telling me to stop.    General Comments        Pertinent Vitals/Pain Pain Assessment: Faces Faces Pain Scale: Hurts worst Pain Location: L LE Pain Descriptors / Indicators: Tender;Grimacing;Guarding;Other (Comment)(screaming) Pain Intervention(s): Monitored during session;Repositioned;Limited activity within patient's tolerance    Home Living  Prior Function            PT Goals (current goals can now be found in the care plan section) Acute Rehab PT Goals Patient Stated Goal: to go home to feed her cats PT Goal Formulation: With patient Time For Goal Achievement: 01/15/19 Potential to Achieve Goals: Poor Progress towards PT goals: Not progressing toward goals - comment    Frequency    Min 2X/week      PT Plan Current plan remains appropriate    Co-evaluation               AM-PAC PT "6 Clicks" Mobility   Outcome Measure  Help needed turning from your back to your side while in a flat bed without using bedrails?: Total Help needed moving from lying on your back to sitting on the side of a flat bed without using bedrails?: Total Help needed moving to and from a bed to a chair (including a wheelchair)?: Total Help needed standing up from a chair using your arms (e.g., wheelchair or bedside chair)?: Total Help needed to walk in hospital room?: Total Help needed climbing 3-5 steps with a railing? : Total 6 Click Score: 6    End of Session   Activity Tolerance: Patient limited by pain Patient left: in bed;with bed alarm set;with call bell/phone within reach;Other (comment)(boots re-applied to B feet) Nurse Communication: Mobility status PT Visit Diagnosis: Muscle weakness (generalized) (M62.81);Pain;History of falling (Z91.81);Other abnormalities of gait and mobility (R26.89) Pain - Right/Left: Left Pain - part of body: Leg     Time: 8841-6606 PT Time Calculation (min) (ACUTE ONLY): 24 min  Charges:  $Therapeutic Exercise: 23-37 mins                     Korissa Horsford, PT, GCS 01/03/19,9:53 AM

## 2019-01-04 DIAGNOSIS — L8990 Pressure ulcer of unspecified site, unspecified stage: Secondary | ICD-10-CM | POA: Insufficient documentation

## 2019-01-04 LAB — CBC
HCT: 32 % — ABNORMAL LOW (ref 36.0–46.0)
Hemoglobin: 10.1 g/dL — ABNORMAL LOW (ref 12.0–15.0)
MCH: 29.8 pg (ref 26.0–34.0)
MCHC: 31.6 g/dL (ref 30.0–36.0)
MCV: 94.4 fL (ref 80.0–100.0)
Platelets: 192 10*3/uL (ref 150–400)
RBC: 3.39 MIL/uL — ABNORMAL LOW (ref 3.87–5.11)
RDW: 13.7 % (ref 11.5–15.5)
WBC: 9.9 10*3/uL (ref 4.0–10.5)
nRBC: 0 % (ref 0.0–0.2)

## 2019-01-04 LAB — COMPREHENSIVE METABOLIC PANEL
ALT: 20 U/L (ref 0–44)
AST: 31 U/L (ref 15–41)
Albumin: 2 g/dL — ABNORMAL LOW (ref 3.5–5.0)
Alkaline Phosphatase: 47 U/L (ref 38–126)
Anion gap: 10 (ref 5–15)
BUN: 25 mg/dL — ABNORMAL HIGH (ref 8–23)
CO2: 21 mmol/L — ABNORMAL LOW (ref 22–32)
Calcium: 8.2 mg/dL — ABNORMAL LOW (ref 8.9–10.3)
Chloride: 108 mmol/L (ref 98–111)
Creatinine, Ser: 1.1 mg/dL — ABNORMAL HIGH (ref 0.44–1.00)
GFR calc Af Amer: 52 mL/min — ABNORMAL LOW (ref >60)
GFR calc non Af Amer: 45 mL/min — ABNORMAL LOW (ref >60)
Glucose, Bld: 108 mg/dL — ABNORMAL HIGH (ref 70–99)
Potassium: 3.8 mmol/L (ref 3.5–5.1)
Sodium: 139 mmol/L (ref 135–145)
Total Bilirubin: 0.6 mg/dL (ref 0.3–1.2)
Total Protein: 5 g/dL — ABNORMAL LOW (ref 6.5–8.1)

## 2019-01-04 NOTE — Plan of Care (Signed)
  Problem: Clinical Measurements: Goal: Will remain free from infection Outcome: Progressing Note: Pt has shown no new signs of infection during my care.    Problem: Nutrition: Goal: Adequate nutrition will be maintained Outcome: Progressing Note: Pt has been able to tolerate at least 50% of all meals during my care.    Problem: Pain Managment: Goal: General experience of comfort will improve Outcome: Progressing Note: Pt has complained of no pain during my care.    Problem: Safety: Goal: Ability to remain free from injury will improve Outcome: Progressing Note: Pt has remained safe and free from falls during my care. Call bell within reach and four side rails up for safety due to use of mattress overlay.

## 2019-01-04 NOTE — NC FL2 (Signed)
[employer] MEDICAID FL2 LEVEL OF CARE SCREENING TOOL     IDENTIFICATION  Patient Name: Emily Camacho Birthdate: 11/19/1928 Sex: female Admission Date (Current Location): 12/31/2018  [facility] and [place] Number:  Producer, television/film/video and Address:  [facility] [place], [address] [place], [place] [address]      Provider Number: [id number]  Attending Physician Name and Address:  Barnetta Chapel, MD  Relative Name and Phone Number:  Farris Has, Daughter, [phone]    Current Level of Care: Hospital Recommended Level of Care: Skilled Nursing Facility Prior Approval Number:    Date Approved/Denied: 01/04/19 PASRR Number: [id number]  Discharge Plan: SNF    Current Diagnoses: Patient Active Problem List   Diagnosis Date Noted  . Malnutrition of moderate degree 01/03/2019  . Fall   . Goals of care, counseling/discussion   . Palliative care by specialist   . DNR (do not resuscitate) discussion   . AKI (acute kidney injury) (HCC)   . Dehydration 12/31/2018  . CARDIOMYOPATHY 09/20/2009  . DYSLIPIDEMIA 09/15/2009  . HYPERTENSION 09/15/2009  . GERD 09/15/2009  . ARTHRITIS 09/15/2009  . EPISTAXIS 09/15/2009  . CHEST PAIN 09/15/2009    Orientation RESPIRATION BLADDER Height & Weight     Self, Place  Normal Incontinent, External catheter Weight: 112 lb 12.8 oz (51.2 kg) Height:     BEHAVIORAL SYMPTOMS/MOOD NEUROLOGICAL BOWEL NUTRITION STATUS      Continent Diet(dys 3 diet, thin liquids)  AMBULATORY STATUS COMMUNICATION OF NEEDS Skin   Limited Assist   Skin abrasions, Other (Comment)(pressure injury on sacrum (PRN dressing change); pressure injury on vertebral column (daily dressing change); pressure injury on right heel (PRN dressing change); abrasion on left hip)                       Personal Care Assistance Level of Assistance  Bathing, Feeding, Dressing Bathing Assistance: Limited assistance Feeding assistance: Independent Dressing  Assistance: Limited assistance     Functional Limitations Info  Sight, Hearing, Speech Sight Info: Adequate Hearing Info: Adequate Speech Info: Adequate    SPECIAL CARE FACTORS FREQUENCY  PT (By licensed PT), OT (By licensed OT), Speech therapy     PT Frequency: 5x/wk OT Frequency: 5x/wk     Speech Therapy Frequency: 1x/wk      Contractures Contractures Info: Not present    Additional Factors Info  Code Status, Allergies Code Status Info: Full Code Allergies Info: : Penicillins, Sulfonamide Derivatives, Latex           Current Medications (01/04/2019):  This is the current hospital active medication list Current Facility-Administered Medications  Medication Dose Route Frequency Provider Last Rate Last Admin  . 0.9 %  sodium chloride infusion   Intravenous PRN Lanae Boast, MD 10 mL/hr at 01/03/19 2201 250 mL at 01/03/19 2201  . acetaminophen (TYLENOL) suppository 650 mg  650 mg Rectal Q4H PRN Kc, Ramesh, MD      . ceFAZolin (ANCEF) IVPB 2g/100 mL premix  2 g Intravenous Q12H Kc, Ramesh, MD 200 mL/hr at 01/04/19 1002 2 g at 01/04/19 1002  . enoxaparin (LOVENOX) injection 30 mg  30 mg Subcutaneous Q24H Rhetta Mura, MD   30 mg at 01/03/19 2205  . feeding supplement (ENSURE ENLIVE) (ENSURE ENLIVE) liquid 237 mL  237 mL Oral BID BM Kc, Ramesh, MD   237 mL at 01/04/19 1010  . labetalol (NORMODYNE) injection 10 mg  10 mg Intravenous Q3H PRN Lanae Boast, MD      .  lisinopril (ZESTRIL) tablet 20 mg  20 mg Oral Daily Kc, Ramesh, MD   20 mg at 01/04/19 1001  . metoprolol succinate (TOPROL-XL) 24 hr tablet 50 mg  50 mg Oral Daily Kc, Ramesh, MD   50 mg at 01/04/19 1001  . multivitamin with minerals tablet 1 tablet  1 tablet Oral Daily Antonieta Pert, MD   1 tablet at 01/04/19 1001  . oxybutynin (DITROPAN) tablet 5 mg  5 mg Oral TID Antonieta Pert, MD   5 mg at 01/04/19 1002  . simvastatin (ZOCOR) tablet 20 mg  20 mg Oral QHS Antonieta Pert, MD   20 mg at 01/03/19 2205  . sodium chloride  flush (NS) 0.9 % injection 3 mL  3 mL Intravenous Q12H Nita Sells, MD   3 mL at 01/04/19 1011     Discharge Medications: Please see discharge summary for a list of discharge medications.  Relevant Imaging Results:  Relevant Lab Results:   Additional Information SSN: [id number]; COVID negative on 01/01/2019  Annaleigha Woo B Emon Miggins, LCSWA
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
## 2019-01-04 NOTE — TOC Initial Note (Signed)
Transition of Care [facility]) - Initial/Assessment Note    Patient Details  Name: Emily Camacho MRN: [id number] Date of Birth: 01-30-1928  [facility] CM/SW Contact:    Gelene Mink, [place] Phone Number: 01/04/2019, 11:33 AM  Clinical Narrative:                  Patient lives at home alone but has her children come and check on her weekly. CSW called and spoke with the patient's daughter, Edd Fabian. CSW introduced herself, explained her role, and shared therapy recommendation. Patient's family is agreeable to sending the patient to rehab. Patient is currently not able to care for herself safely at home alone.   Family is agreeable for the patient being faxed out locally and they receive bed offers. CSW obtained permission to fax the patient out. CSW will provide bed offers to the patient's family. CSW provided CMS SNF list via email.   Patient has Medicare and will not need insurance authorization but will need an updated COVID screen within 48 hours of discharge.   CSW will continue to follow and assist with disposition planning.   Expected Discharge Plan: Skilled Nursing Facility Barriers to Discharge: Continued Medical Work up, SNF Pending bed offer   Patient Goals and CMS Choice Patient states their goals for this hospitalization and ongoing recovery are:: Pt family is agreeable to rehab near the patient's home CMS Medicare.gov Compare Post Acute Care list provided to:: Patient Represenative (must comment) Choice offered to / list presented to : Adult Children  Expected Discharge Plan and Services Expected Discharge Plan: [place] In-house Referral: Clinical Social Work Discharge Planning Services: NA Post Acute Care Choice: [place] Living arrangements for the past 2 months: Single Family Home                 DME Arranged: N/A DME Agency: NA       HH Arranged: NA [place] Agency: NA        Prior Living  Arrangements/Services Living arrangements for the past 2 months: [place] with:: Self Patient language and need for interpreter reviewed:: No Do you feel safe going back to the place where you live?: Yes      Need for Family Participation in Patient Care: Yes (Comment) Care giver support system in place?: Yes (comment) Current home services: DME Criminal Activity/Legal Involvement Pertinent to Current Situation/Hospitalization: No - Comment as needed  Activities of Daily Living      Permission Sought/Granted Permission sought to share information with : Case Manager Permission granted to share information with : Yes, Verbal Permission Granted  Share Information with NAME: Edd Fabian  Permission granted to share info w AGENCY: All SNF  Permission granted to share info w Relationship: Daughter     Emotional Assessment Appearance:: Appears stated age Attitude/Demeanor/Rapport: Unable to Assess Affect (typically observed): Unable to Assess Orientation: : Oriented to Self, Oriented to Place Alcohol / Substance Use: Not Applicable Psych Involvement: No (comment)  Admission diagnosis:  Dehydration [E86.0] Hypernatremia [E87.0] Fall [W19.XXXA] Right leg pain [M79.604] AKI (acute kidney injury) ([place]) [N17.9] Fall, initial encounter [W19.XXXA] Traumatic rhabdomyolysis, initial encounter ([place]) [T79.6XXA] AMS (altered mental status) [R41.82] Patient Active Problem List   Diagnosis Date Noted  . Malnutrition of moderate degree 01/03/2019  . Fall   . Goals of care, counseling/discussion   . Palliative care by specialist   . DNR (do not resuscitate) discussion   . AKI (acute kidney injury) ([place])   .  Dehydration 12/31/2018  . CARDIOMYOPATHY 09/20/2009  . DYSLIPIDEMIA 09/15/2009  . HYPERTENSION 09/15/2009  . GERD 09/15/2009  . ARTHRITIS 09/15/2009  . EPISTAXIS 09/15/2009  . CHEST PAIN 09/15/2009   PCP:  Koren Shiver, DO Pharmacy:   [phone] -  [place], [place] - [address] [place] [address] Phone: [phone] Fax: [phone]     Social Determinants of Health (SDOH) Interventions    Readmission Risk Interventions No flowsheet data found.

## 2019-01-05 DIAGNOSIS — T796XXA Traumatic ischemia of muscle, initial encounter: Secondary | ICD-10-CM

## 2019-01-05 DIAGNOSIS — L8990 Pressure ulcer of unspecified site, unspecified stage: Secondary | ICD-10-CM
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
## 2019-01-05 NOTE — Progress Notes (Signed)
Daily Progress Note   Patient Name: Emily Camacho       Date: 01/05/2019 DOB: 17-Mar-1928  Age: 85 y.o. MRN#: [id number] Attending Physician: Barnetta Chapel, MD Primary Care Physician: Koren Shiver, DO Admit Date: 12/31/2018  Reason for Consultation/Follow-up: Establishing goals of care  Subjective/GOC: Patient awake, alert, and oriented to self, place, situation. Believes it is Saturday (reoriented to date). Denies pain and tells me she ate breakfast and lunch. She prefers only one Ensure a day.   No family at bedside. Discussed plan of care including medical diagnoses and interventions with patient. Explained that she will need to discharge to SNF for rehab. Patient is not thrilled to hear this but acknowledges she is very weak and cannot walk without her walker. She lives in [place]. Two local sons. Also, three other adult children that live out of town.   Patient shares that she has a living will and her eldest son, Emily Camacho is POA. Attempted to explore patients thoughts regarding heroic/life prolonging interventions such as life support/resuscitation/feeding tube. She states "I haven't thought of that before" and jokes of her hope that she will "roll over and die" when the time comes. Explained that it is not always that 'easy' and importance of discussing with her children her wishes if faced with a critical illness. Discussed quality of life and recommendations against heroic measures if she was faced with a critical or terminal condition.   It would be important to discuss AD/code status conversation with patient and family together. VM left for son, Emily Camacho.     Length of Stay: 5  Current Medications: Scheduled Meds:  . enoxaparin (LOVENOX) injection  30 mg Subcutaneous Q24H    . feeding supplement (ENSURE ENLIVE)  237 mL Oral BID BM  . lisinopril  20 mg Oral Daily  . metoprolol succinate  50 mg Oral Daily  . multivitamin with minerals  1 tablet Oral Daily  . oxybutynin  5 mg Oral TID  . simvastatin  20 mg Oral QHS  . sodium chloride flush  3 mL Intravenous Q12H    Continuous Infusions: . sodium chloride Stopped (01/04/19 1108)  .  ceFAZolin (ANCEF) IV 2 g (01/05/19 1000)    PRN Meds: sodium chloride, acetaminophen, labetalol  Physical Exam Vitals and nursing note reviewed.  Constitutional:  General: She is awake.  HENT:     Head: Normocephalic and atraumatic.  Pulmonary:     Effort: No tachypnea, accessory muscle usage or respiratory distress.  Skin:    General: Skin is warm and dry.  Neurological:     Mental Status: She is alert and oriented to person, place, and time.  Psychiatric:        Mood and Affect: Mood normal.        Speech: Speech normal.        Behavior: Behavior normal.             Vital Signs: BP (!) 97/55 (BP Location: Left Arm)   Pulse 62   Temp 98.1 F (36.7 C)   Resp 18   Ht 4\' 9"  (1.448 m) Comment: unable to stand, pt. states her height  Wt 51.2 kg   SpO2 96%   BMI 24.43 kg/m  SpO2: SpO2: 96 % O2 Device: O2 Device: Room Air O2 Flow Rate:    Intake/output summary:   Intake/Output Summary (Last 24 hours) at 01/05/2019 1435 Last data filed at 01/05/2019 1100 Gross per 24 hour  Intake 1004.26 ml  Output 600 ml  Net 404.26 ml   LBM: Last BM Date: (PTA) Baseline Weight: Weight: 51.2 kg Most recent weight: Weight: 51.2 kg       Palliative Assessment/Data:  PPS 40%    Flowsheet Rows     Most Recent Value  Intake Tab  Referral Department  Hospitalist  Unit at Time of Referral  Cardiac/Telemetry Unit  Palliative Care Primary Diagnosis  Trauma  Date Notified  01/01/19  Palliative Care Type  New Palliative care  Reason for referral  Clarify Goals of Care, Advance Care Planning  Date of Admission   12/31/18  Date first seen by Palliative Care  01/03/19  # of days Palliative referral response time  2 Day(s)  # of days IP prior to Palliative referral  1  Clinical Assessment  Palliative Performance Scale Score  30%  Pain Max last 24 hours  Not able to report  Pain Min Last 24 hours  Not able to report  Dyspnea Max Last 24 Hours  Not able to report  Dyspnea Min Last 24 hours  Not able to report  Psychosocial & Spiritual Assessment  Palliative Care Outcomes      Patient Active Problem List   Diagnosis Date Noted  . Pressure injury of skin 01/04/2019  . Malnutrition of moderate degree 01/03/2019  . Fall   . Goals of care, counseling/discussion   . Palliative care by specialist   . DNR (do not resuscitate) discussion   . AKI (acute kidney injury) (HCC)   . Dehydration 12/31/2018  . CARDIOMYOPATHY 09/20/2009  . DYSLIPIDEMIA 09/15/2009  . HYPERTENSION 09/15/2009  . GERD 09/15/2009  . ARTHRITIS 09/15/2009  . EPISTAXIS 09/15/2009  . CHEST PAIN 09/15/2009    Palliative Care Assessment & Plan   Patient Profile: 85 y.o. female  with past medical history of cardiomyopathy -Takatbubo, 20 lb weight loss, GERD, hypertension, dyslipidemia, arthritis, knee surgery, toe surgery, admitted on 12/31/2018 with acute dehydration with metabolic acidosis, acute renal failure, hypernatremia, rhabdomyolysis with wounds related to being on the floor for multiple days.   Assessment: Acute dehydration  Metabolic acidosis ARF Hypernatremia Mild rhabdomyolysis Osteonecrosis bilateral hips Moderate protein calorie malnutrition Adult failure to thrive Sacral decubitus on admission Hx of Takotsubo cardiomyopathy  Recommendations/Plan:  Continue FULL code/FULL scope treatment. Patient much more awake/alert today compared to palliative  provider discussion on 12/11, but no family at bedside to discuss goals of care/code status. VM left for son, Emily Camacho. Patient does confirm she has a living will and  eldest son, Emily Camacho is POA.  Continue current plan of care and medical management.   Plan for SNF for rehab. May benefit from outpatient palliative referral.   Goals of Care and Additional Recommendations:  Limitations on Scope of Treatment: Full Scope Treatment  Code Status: FULL   Code Status Orders  (From admission, onward)         Start     Ordered   12/31/18 2115  Full code  Continuous     12/31/18 2115        Code Status History    This patient has a current code status but no historical code status.   Advance Care Planning Activity       Prognosis:   Unable to determine  Discharge Planning:  [place] for rehab with Palliative care service follow-up  Care plan was discussed with patient, RN, VM left for son  Thank you for allowing the Palliative Medicine Team to assist in the care of this patient.   Time In: 1400 Time Out: 1435 Total Time 35 Prolonged Time Billed  no      Greater than 50%  of this time was spent counseling and coordinating care related to the above assessment and plan.  Ihor Dow, DNP, FNP-C Palliative Medicine Team  Phone: [phone] Fax: [phone]  Please contact Palliative Medicine Team phone at [phone] for questions and concerns.

## 2019-01-06 ENCOUNTER — Inpatient Hospital Stay (HOSPITAL_COMMUNITY): Payer: Medicare Other

## 2019-01-06 DIAGNOSIS — E440 Moderate protein-calorie malnutrition: Secondary | ICD-10-CM

## 2019-01-06 NOTE — TOC Progression Note (Addendum)
iTransition of Care [facility]) - Progression Note    Patient Details  Name: Emily Camacho MRN: [id number] Date of Birth: [birth year]  Transition of Care [facility]) CM/SW Contact  Sharlet Salina Mila Homer, LCSW Phone Number: 01/06/2019, 2:52 PM  Clinical Narrative: Talked with daughter Bari Mantis [phone]) and bed offers provided. Discussed the need to have facility choice prior to patients readiness for discharge and that she would be advised of when Ms. Schlechter ready for d/c.   4:16 pm: CSW received a text from daughter Edd Fabian providing her facility choice of [facility]. Call made to [facility], admissions director (4:22 pm) and informed her of daughter's choice, and they can take her.  4:20 pm: MD contacted regarding facility choice and was advised that patient is ready for discharge. Dr. Lupita Leash will order a COVID test.    Expected Discharge Plan: [place] Barriers to Discharge: Continued Medical Work up, SNF Pending bed offer  Expected Discharge Plan and Services Expected Discharge Plan: [place] In-house Referral: Clinical Social Work Discharge Planning Services: NA Post Acute Care Choice: [place] Living arrangements for the past 2 months: Single Family Home                 DME Arranged: N/A DME Agency: NA       HH Arranged: NA HH Agency: NA         Social Determinants of Health (SDOH) Interventions  No SDOH interventions needed at this time.  Readmission Risk Interventions No flowsheet data found.

## 2019-01-06 NOTE — Progress Notes (Signed)
PT Cancellation Note  Patient Details Name: Emily Camacho MRN: [id number] DOB: [birth year]   Cancelled Treatment:    Reason Eval/Treat Not Completed: Patient declined, no reason specified. Attempted twice. Both times, pt stating she was going home later today and it was going to be a long walk in the cold, but only a few blocks.  Attempted re-orientation to current situation, but pt continued to decline attempts at sitting EOB and/or therex.  Pt told PT to leave. Nursing aware. Will check back as schedule permits.  Galen Manila 01/06/2019, 1:23 PM
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
## 2019-01-06 NOTE — Plan of Care (Signed)
  Problem: Clinical Measurements: Goal: Will remain free from infection Outcome: Progressing Note: Pt has shown no new signs of infection during my care.    Problem: Activity: Goal: Risk for activity intolerance will decrease Outcome: Progressing Note: Pt has remained in bed during my care and refused to work with PT.    Problem: Nutrition: Goal: Adequate nutrition will be maintained Outcome: Not Progressing Note: Pt has maintained a poor appetite during my care. She has refused to take her Ensures also.    Pt has been more confused during my shift. Alert to self and has to repeatedly be reminded that she is in the hospital and may not d/c today.

## 2019-01-07 ENCOUNTER — Other Ambulatory Visit: Payer: Self-pay | Admitting: Obstetrics & Gynecology

## 2019-01-07 DIAGNOSIS — R1900 Intra-abdominal and pelvic swelling, mass and lump, unspecified site: Secondary | ICD-10-CM

## 2019-01-07 LAB — CBC
HCT: 30.4 % — ABNORMAL LOW (ref 36.0–46.0)
Hemoglobin: 9.9 g/dL — ABNORMAL LOW (ref 12.0–15.0)
MCH: 29.9 pg (ref 26.0–34.0)
MCHC: 32.6 g/dL (ref 30.0–36.0)
MCV: 91.8 fL (ref 80.0–100.0)
Platelets: 272 10*3/uL (ref 150–400)
RBC: 3.31 MIL/uL — ABNORMAL LOW (ref 3.87–5.11)
RDW: 13.4 % (ref 11.5–15.5)
WBC: 7.7 10*3/uL (ref 4.0–10.5)
nRBC: 0 % (ref 0.0–0.2)

## 2019-01-07 LAB — SARS CORONAVIRUS 2 (TAT 6-24 HRS): SARS Coronavirus 2: NEGATIVE

## 2019-01-07 LAB — CREATININE, SERUM
Creatinine, Ser: 1.06 mg/dL — ABNORMAL HIGH (ref 0.44–1.00)
GFR calc Af Amer: 54 mL/min — ABNORMAL LOW (ref >60)
GFR calc non Af Amer: 47 mL/min — ABNORMAL LOW (ref >60)

## 2019-01-07 MED ORDER — LISINOPRIL 20 MG PO TABS: 20.0000 mg | ORAL_TABLET | Freq: Every day | ORAL | Status: AC

## 2019-01-07 MED ORDER — CEPHALEXIN 500 MG PO CAPS: 500.0000 mg | ORAL_CAPSULE | Freq: Three times a day (TID) | ORAL | 0 refills | Status: DC

## 2019-01-07 NOTE — Discharge Summary (Addendum)
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 DETECTED Final   Escherichia coli NOT DETECTED NOT DETECTED Final   Klebsiella oxytoca NOT DETECTED NOT DETECTED Final   Klebsiella pneumoniae NOT DETECTED NOT DETECTED Final   Proteus species DETECTED (A) NOT DETECTED Final    Comment: CRITICAL RESULT CALLED TO, READ BACK BY AND VERIFIED WITH: Norva Riffle PharmD 11:55 01/01/19 ([email or web address])    Serratia marcescens NOT DETECTED NOT DETECTED Final   Carbapenem resistance NOT DETECTED NOT DETECTED Final   Haemophilus influenzae NOT DETECTED NOT DETECTED Final   Neisseria meningitidis NOT DETECTED NOT DETECTED Final   Pseudomonas aeruginosa NOT DETECTED NOT DETECTED Final   Candida albicans NOT DETECTED NOT DETECTED Final   Candida glabrata NOT DETECTED NOT DETECTED Final   Candida krusei NOT DETECTED NOT DETECTED Final   Candida parapsilosis NOT DETECTED NOT DETECTED Final   Candida tropicalis NOT DETECTED NOT DETECTED Final    Comment: Performed at [facility], [address], [place], [place] [address]  SARS CORONAVIRUS 2 (TAT 6-24 HRS) Nasopharyngeal  Nasopharyngeal Swab     Status: None   Collection Time: 12/31/18  4:07 PM   Specimen: Nasopharyngeal Swab  Result Value Ref Range Status   SARS Coronavirus 2 NEGATIVE NEGATIVE Final    Comment: (NOTE) SARS-CoV-2 target nucleic acids are NOT DETECTED. The SARS-CoV-2 RNA is generally detectable in upper and lower respiratory specimens during the acute phase of infection. Negative results do not preclude SARS-CoV-2 infection, do not rule out co-infections with other pathogens, and should not be used as the sole basis for treatment or other patient management decisions. Negative results must be combined with clinical observations, patient history, and epidemiological information. The expected result is Negative. Fact Sheet for Patients: HairSlick.no Fact Sheet for Healthcare Providers: quierodirigir.com This test is not yet approved or cleared by the [place] FDA and  has been authorized for detection and/or diagnosis  of SARS-CoV-2 by FDA under an Emergency Use Authorization (EUA). This EUA will remain  in effect (meaning this test can be used) for the duration of the COVID-19 declaration under Section 56 4(b)(1) of the Act, 21 U.S.C. section 360bbb-3(b)(1), unless the authorization is terminated or revoked sooner. Performed at [facility], [address], [place], [place] [address]      Labs: BNP (last 3 results) No results for input(s): BNP in the last 8760 hours. Basic Metabolic Panel: Recent Labs  Lab 12/31/18 1357 01/01/19 0438 01/01/19 1745 01/02/19 0444 01/03/19 0339 01/04/19 0546 01/07/19 0551  NA 153* 154* 149* 152* 142 139  --   K 3.8 2.9* 4.5 3.6 3.4* 3.8  --   CL 112* 115* 118* 119* 111 108  --   CO2 20* 21*  --   GLUCOSE 114* 173* 146* 114* 139* 108*  --   BUN 58* 51* 53* 41* 32* 25*  --   CREATININE 1.36* 1.20* 1.15* 1.05* 1.17* 1.10* 1.06*  CALCIUM 10.0 9.2 8.7* 9.1 8.4* 8.2*  --    MG 2.2 2.1  --   --   --   --   --    Liver Function Tests: Recent Labs  Lab 12/31/18 1357 01/01/19 0438 01/02/19 0444 01/03/19 0339 01/04/19 0546  AST 60* 42* 32 30 31  ALT 36 ALKPHOS 76 57 52 46 47  BILITOT 1.3* 0.9 0.4 0.6 0.6  PROT 8.1 6.5 5.9* 5.1* 5.0*  ALBUMIN 3.5 2.7* 2.5* 2.1* 2.0*   Recent Labs  Lab 12/31/18 1357  LIPASE 30   No results for input(s): AMMONIA in the last 168 hours. CBC: Recent Labs  Lab 12/31/18 1357 01/01/19 0438 01/02/19 0444 01/03/19 0339 01/04/19 0546 01/07/19 0551  WBC 15.1* 10.1 6.6 8.6 9.9 7.7  NEUTROABS 13.5* 8.6*  --   --   --   --   HGB 13.9 11.7* 11.2* 9.8* 10.1* 9.9*  HCT 43.9 37.7 35.8* 30.4* 32.0* 30.4*  MCV 95.6 97.4 96.0 92.1 94.4 91.8  PLT 350 268 241 192 192 272   Cardiac Enzymes: Recent Labs  Lab 12/31/18 1357 01/01/19 0438  CKTOTAL 655* 372*   BNP: Invalid input(s): POCBNP CBG: No results for input(s): GLUCAP in the last 168 hours. D-Dimer No results for input(s): DDIMER in the last 72 hours. Hgb A1c No results for input(s): HGBA1C in the last 72 hours. Lipid Profile No results for input(s): CHOL, HDL, LDLCALC, TRIG, CHOLHDL, LDLDIRECT in the last 72 hours. Thyroid function studies No results for input(s): TSH, T4TOTAL, T3FREE, THYROIDAB in the last 72 hours.  Invalid input(s): FREET3 Anemia work up No results for input(s): VITAMINB12, FOLATE, FERRITIN, TIBC, IRON, RETICCTPCT in the last 72 hours. Urinalysis    Component Value Date/Time   COLORURINE YELLOW 12/31/2018 1420   APPEARANCEUR HAZY (A) 12/31/2018 1420   LABSPEC 1.016 12/31/2018 1420   PHURINE 5.0 12/31/2018 1420   GLUCOSEU NEGATIVE 12/31/2018 1420   HGBUR MODERATE (A) 12/31/2018 1420   BILIRUBINUR NEGATIVE 12/31/2018 1420   KETONESUR 20 (A) 12/31/2018 1420   PROTEINUR NEGATIVE 12/31/2018 1420   UROBILINOGEN 0.2 07/13/2009 1622   NITRITE NEGATIVE 12/31/2018 1420   LEUKOCYTESUR NEGATIVE 12/31/2018 1420   Sepsis  Labs Invalid input(s): PROCALCITONIN,  WBC,  LACTICIDVEN Microbiology Recent Results (from the past 240 hour(s))  Blood culture (routine x 2)     Status: Abnormal   Collection Time: 12/31/18  2:05 PM  Specimen: BLOOD  Result Value Ref Range Status   Specimen Description BLOOD LEFT ANTECUBITAL  Final   Special Requests   Final    BOTTLES DRAWN AEROBIC AND ANAEROBIC Blood Culture adequate volume   Culture  Setup Time   Final    GRAM POSITIVE COCCI IN CLUSTERS IN BOTH AEROBIC AND ANAEROBIC BOTTLES CRITICAL RESULT CALLED TO, READ BACK BY AND VERIFIED WITH: E. SINCLAIR PHARMD, AT 1047 01/01/19 BY D. VANHOOK    Culture (A)  Final    STAPHYLOCOCCUS SPECIES (COAGULASE NEGATIVE) THE SIGNIFICANCE OF ISOLATING THIS ORGANISM FROM A SINGLE SET OF BLOOD CULTURES WHEN MULTIPLE SETS ARE DRAWN IS UNCERTAIN. PLEASE NOTIFY THE MICROBIOLOGY DEPARTMENT WITHIN ONE WEEK IF SPECIATION AND SENSITIVITIES ARE REQUIRED. PROTEUS MIRABILIS SUSCEPTIBILITIES PERFORMED ON PREVIOUS CULTURE WITHIN THE LAST 5 DAYS. Performed at [facility], [address], [place], [place] [address]    Report Status 01/03/2019 FINAL  Final  Blood culture (routine x 2)     Status: Abnormal   Collection Time: 12/31/18  2:20 PM   Specimen: BLOOD  Result Value Ref Range Status   Specimen Description BLOOD RIGHT ANTECUBITAL  Final   Special Requests   Final    BOTTLES DRAWN AEROBIC AND ANAEROBIC Blood Culture adequate volume   Culture  Setup Time   Final    GRAM NEGATIVE RODS IN BOTH AEROBIC AND ANAEROBIC BOTTLES CRITICAL RESULT CALLED TO, READ BACK BY AND VERIFIED WITH: Norva Riffle PharmD 11:55 01/01/19 ([email or web address]) Performed at [facility], [address], [place], [place] [address]    Culture PROTEUS MIRABILIS (A)  Final   Report Status 01/03/2019 FINAL  Final   Organism ID, Bacteria PROTEUS MIRABILIS  Final      Susceptibility   Proteus mirabilis - MIC*    AMPICILLIN <=2 SENSITIVE Sensitive     CEFAZOLIN <=4  SENSITIVE Sensitive     CEFEPIME <=1 SENSITIVE Sensitive     CEFTAZIDIME <=1 SENSITIVE Sensitive     CEFTRIAXONE <=1 SENSITIVE Sensitive     CIPROFLOXACIN <=0.25 SENSITIVE Sensitive     GENTAMICIN <=1 SENSITIVE Sensitive     IMIPENEM 4 SENSITIVE Sensitive     TRIMETH/SULFA <=20 SENSITIVE Sensitive     AMPICILLIN/SULBACTAM <=2 SENSITIVE Sensitive     PIP/TAZO <=4 SENSITIVE Sensitive     * PROTEUS MIRABILIS  Blood Culture ID Panel (Reflexed)     Status: Abnormal   Collection Time: 12/31/18  2:20 PM  Result Value Ref Range Status   Enterococcus species NOT DETECTED NOT DETECTED Final   Listeria monocytogenes NOT DETECTED NOT DETECTED Final   Staphylococcus species NOT DETECTED NOT DETECTED Final   Staphylococcus aureus (BCID) NOT DETECTED NOT DETECTED Final   Streptococcus species NOT DETECTED NOT DETECTED Final   Streptococcus agalactiae NOT DETECTED NOT DETECTED Final   Streptococcus pneumoniae NOT DETECTED NOT DETECTED Final   Streptococcus pyogenes NOT DETECTED NOT DETECTED Final   Acinetobacter baumannii NOT DETECTED NOT DETECTED Final   Enterobacteriaceae species DETECTED (A) NOT DETECTED Final    Comment: Enterobacteriaceae represent a large family of gram-negative bacteria, not a single organism. CRITICAL RESULT CALLED TO, READ BACK BY AND VERIFIED WITH: Norva Riffle PharmD 11:55 01/01/19 ([email or web address])    Enterobacter cloacae complex NOT DETECTED NOT DETECTED Final   Escherichia coli NOT DETECTED NOT DETECTED Final   Klebsiella oxytoca NOT DETECTED NOT DETECTED Final   Klebsiella pneumoniae NOT DETECTED NOT DETECTED Final   Proteus species DETECTED (A) NOT DETECTED Final  Comment: CRITICAL RESULT CALLED TO, READ BACK BY AND VERIFIED WITH: Norva Riffle PharmD 11:55 01/01/19 ([email or web address])    Serratia marcescens NOT DETECTED NOT DETECTED Final   Carbapenem resistance NOT DETECTED NOT DETECTED Final   Haemophilus influenzae NOT DETECTED NOT DETECTED Final   Neisseria meningitidis NOT  DETECTED NOT DETECTED Final   Pseudomonas aeruginosa NOT DETECTED NOT DETECTED Final   Candida albicans NOT DETECTED NOT DETECTED Final   Candida glabrata NOT DETECTED NOT DETECTED Final   Candida krusei NOT DETECTED NOT DETECTED Final   Candida parapsilosis NOT DETECTED NOT DETECTED Final   Candida tropicalis NOT DETECTED NOT DETECTED Final    Comment: Performed at [facility], [address] [place], [place] [address]  SARS CORONAVIRUS 2 (TAT 6-24 HRS) Nasopharyngeal Nasopharyngeal Swab     Status: None   Collection Time: 12/31/18  4:07 PM   Specimen: Nasopharyngeal Swab  Result Value Ref Range Status   SARS Coronavirus 2 NEGATIVE NEGATIVE Final    Comment: (NOTE) SARS-CoV-2 target nucleic acids are NOT DETECTED. The SARS-CoV-2 RNA is generally detectable in upper and lower respiratory specimens during the acute phase of infection. Negative results do not preclude SARS-CoV-2 infection, do not rule out co-infections with other pathogens, and should not be used as the sole basis for treatment or other patient management decisions. Negative results must be combined with clinical observations, patient history, and epidemiological information. The expected result is Negative. Fact Sheet for Patients: HairSlick.no Fact Sheet for Healthcare Providers: quierodirigir.com This test is not yet approved or cleared by the [place] FDA and  has been authorized for detection and/or diagnosis of SARS-CoV-2 by FDA under an Emergency Use Authorization (EUA). This EUA will remain  in effect (meaning this test can be used) for the duration of the COVID-19 declaration under Section 56 4(b)(1) of the Act, 21 U.S.C. section 360bbb-3(b)(1), unless the authorization is terminated or revoked sooner. Performed at [facility], [address], [place], [place] [address]      Time coordinating discharge: 35  minutes  SIGNED:   Lanae Boast, MD  [facility] 01/07/2019, 1:27 PM  If 7PM-7AM, please contact night-coverage [email or web address]

## 2019-01-07 NOTE — Progress Notes (Signed)
Pt refused PT earlier for any therapy including exercises in bed due to tiredness, generalized discomfort and not feeling like she really wanted to do anything.  Now in HD, so will try again at another time.   01/07/19 1300  PT Visit Information  Last PT Received On 01/07/19  Reason Eval/Treat Not Completed Other (comment)    Mee Hives, PT MS Acute Rehab Dept. Number: [place] [facility] [place]

## 2019-01-07 NOTE — Progress Notes (Signed)
RN called to advise when PTAR arrived to take pt to a SNF, she refused.  No POA on chart. Changed D/C date to 01/08/19.

## 2019-01-07 NOTE — TOC Transition Note (Signed)
Transition of Care [facility]) - CM/SW Discharge Note 01/07/19 - Discharged to [facility] via ambulance   Patient Details  Name: Emily Camacho MRN: [id number] Date of Birth: 08/16/1928  Transition of Care [facility]) CM/SW Contact:  Sable Feil, LCSW Phone Number: 01/07/2019, 4:13 PM   Clinical Narrative:  COVID test resulted negative and patient discharging to [facility]. Daughter Bari Mantis [phone])  aware of discharge and completed admissions paperwork at facility. Discharge paperwork transmitted to [facility]. RN Mariea Clonts provided with information to call report and non-emergency ambulance transport arranged.     Final next level of care: Skilled Nursing Facility([facility]) Barriers to Discharge: No Barriers Identified   Patient Goals and CMS Choice Patient states their goals for this hospitalization and ongoing recovery are:: Family agreeable to [facility] re[facility] CMS Medicare.gov Compare Post Acute Care list provided to:: Patient Represenative (must comment)(Daughter provided with information regarding [email or web address]) Choice offered to / list presented to : Adult Children  Discharge Placement   Existing PASRR number confirmed : 01/04/19          Patient chooses bed at: [facility]) Patient to be transferred to facility by: Non-emergency ambulance Name of family member notified: Daughter Bari Mantis - [phone] Patient and family notified of of transfer: 01/07/19  Discharge Plan and Services In-house Referral: Clinical Social Work Discharge Planning Services: NA Post Acute Care Choice: [address]          DME Arranged: N/A DME Agency: NA       HH Arranged: NA HH Agency: NA        Social Determinants of Health (SDOH) Interventions  No SDOH interventions needed prior to discharge   Readmission Risk Interventions No flowsheet data found.

## 2019-01-07 NOTE — Plan of Care (Signed)
  Problem: Education: Goal: Knowledge of General Education information will improve Description: Including pain rating scale, medication(s)/side effects and non-pharmacologic comfort measures Outcome: Progressing   Problem: Safety: Goal: Ability to remain free from injury will improve Outcome: Progressing   Problem: Skin Integrity: Goal: Risk for impaired skin integrity will decrease Outcome: Progressing   

## 2019-01-07 NOTE — Progress Notes (Signed)
PTAR was here to transport patient to [facility]. Patient refused to go. EMT with PTAR said patient was alert and oriented and therefore they could not transport if she refused. Patient alert and oriented to self,place,date,president and why she was here. There were no POA or Legal Guardian papers found. Nothing stating she was incompetent to make decisions. Rosey Bath informed. Discharge changed to 01/08/19 when issue can be resolved. Dtr, Edd Fabian Cox notified. Attempted to notify son, Rilei Kravitz message on recorder.

## 2019-01-07 NOTE — Progress Notes (Signed)
Spoke with son, Cloa Bushong, notified of discharge cancelled tonight.

## 2019-01-07 NOTE — Progress Notes (Signed)
Emily Camacho @ [facility] notified of discharge cancelled tonight due to patient refusal to go and PTAR refusal to transport.

## 2019-01-08 MED ORDER — AMOXICILLIN 500 MG PO CAPS
500.0000 mg | ORAL_CAPSULE | Freq: Once | ORAL | Status: AC
  Administered 2019-01-08: 500 mg via ORAL
  Filled 2019-01-08: qty 1

## 2019-01-08 MED ORDER — ENSURE ENLIVE PO LIQD: 237.0000 mL | Freq: Two times a day (BID) | ORAL | Status: DC

## 2019-01-08 MED ORDER — EPINEPHRINE 0.3 MG/0.3ML IJ SOAJ
0.3000 mg | Freq: Once | INTRAMUSCULAR | Status: DC | PRN
  Filled 2019-01-08: qty 0.6

## 2019-01-08 MED ORDER — DIPHENHYDRAMINE HCL 50 MG/ML IJ SOLN: 25.0000 mg | Freq: Once | INTRAMUSCULAR | Status: DC | PRN

## 2019-01-08 NOTE — Progress Notes (Signed)
Penicillin Allergy Assessment   Penicillin Allergy History: Emily Camacho is an 85 year old female who has a penicillin allergy with an unspecified reaction listed in her medical chart. I spoke with the patient directly to clarify her history. She remembers having a reaction to penicillin about 30 years ago which resulted in her immediately developing a rash. She said the rash covered her whole body, it did not blister or peel and it went away on its own. She has not tried an penicillin or penicillin like antibiotics since. However, she has tolerated a first generation cephalosporin (cefazolin) for the past 5 days in the hospital.   Given her remote and low risk history it is reasonable to do a direct challenge with amoxicillin to see if she is still penicillin allergic. The patient is agreeable to trying this after hearing the benefits that come along with being able to tolerate penicillin antibiotics. These include less adverse drug events, lower risk of multidrug resistant infections and being able to use the first line recommended antibiotics if she should ever need antibiotics again.   I spoke with her nurse and asked if the patient could be checked on every 15 minutes for the first hour after taking the amoxicillin and if he could document any reactions the patient reports. If the patient tolerates the challenge, I will remove her penicillin allergy from her medical record and contact her PCP and pharmacy to remove her allergy as well.  Post-Challenge The patient tolerated her amoxicillin challenge with no issues. She is not penicillin allergic and I will remove her allergy from her medical record.   Nicoletta Dress, PharmD PGY2 Infectious Disease Pharmacy Resident

## 2019-01-08 NOTE — Progress Notes (Signed)
Progress Notes  Patient was supposed to DC to SNF yesterday but she refused.  She is agreeable to go to SNF today.  I edited her discharge medications and updated her discharge summary including meds, subjective and objective for today.  Communicated with nursing and clinical social work.  Discussed with her daughter.  Vernell Leep, MD, [place], [facility]. [facility]  To contact the attending provider between 7A-7P or the covering provider during after hours 7P-7A, please log into the web site [email or web address] and access using universal [place] password for that web site. If you do not have the password, please call the hospital operator.

## 2019-01-08 NOTE — Progress Notes (Signed)
Physical Therapy Treatment Patient Details Name: Emily Camacho MRN: [id number] DOB: [birth year] Today's Date: 01/08/2019    History of Present Illness 89 WF HTN, Takotsubo cardiomyopathy with systolic heart failure on cath 08/14/2009, HTN, HLD, prior cirrhosis, prior DVT (details unknown), and bladder prolapse status post repair 2003.  Details unknown-found down on floor covered in feces, urine, and cat food. She was on ground for possibly 6 days. Found with pressure ulcers on back and sacrum.    PT Comments    Pt was seen for mobility today, note her pain to attempt bed to sit transition despite moving her legs first.  Pt does have significant skin changes from her time on the ground at home, and will be moving to SNF for wound care and follow up with rehab.  See acutely as needed for progression to chair when able and to work on standing control again as pt will allow.   Follow Up Recommendations  SNF     Equipment Recommendations  None recommended by PT    Recommendations for Other Services       Precautions / Restrictions Precautions Precautions: Fall Precaution Comments: monitor her pain complaints Restrictions Weight Bearing Restrictions: No    Mobility  Bed Mobility Overal bed mobility: Needs Assistance Bed Mobility: Supine to Sit Rolling: Total assist   Supine to sit: Total assist     General bed mobility comments: Will not allow PT to assist moving legs to side of bed, but will do AAROM to both legs  Transfers                 General transfer comment: declined to let PT help  Ambulation/Gait                 Stairs             Wheelchair Mobility    Modified Rankin (Stroke Patients Only)       Balance Overall balance assessment: Needs assistance                                          Cognition Arousal/Alertness: Awake/alert Behavior During Therapy: Anxious;WFL for tasks assessed/performed(depending on her  pain) Overall Cognitive Status: No family/caregiver present to determine baseline cognitive functioning Area of Impairment: Problem solving;Awareness;Safety/judgement;Following commands;Memory;Attention;Orientation                 Orientation Level: Situation Current Attention Level: Selective Memory: Decreased recall of precautions;Decreased short-term memory Following Commands: Follows one step commands inconsistently;Follows one step commands with increased time Safety/Judgement: Decreased awareness of safety;Decreased awareness of deficits Awareness: Intellectual Problem Solving: Slow processing;Decreased initiation;Difficulty sequencing;Requires verbal cues;Requires tactile cues General Comments: pt does not realize how she was when she came to hosp, talks about very recently walking and being fine      Exercises General Exercises - Lower Extremity Ankle Circles/Pumps: AAROM;Both;5 reps Heel Slides: AAROM;Both;10 reps Hip ABduction/ADduction: AAROM;Both;10 reps Straight Leg Raises: AAROM;Both;10 reps Hip Flexion/Marching: AAROM;Both;10 reps    General Comments General comments (skin integrity, edema, etc.): pt has BLE edema, has prevalon boots applied correctly to protect heels and lower legs      Pertinent Vitals/Pain Pain Assessment: Faces Faces Pain Scale: Hurts whole lot Pain Location: LLE with any movement to side of bed and L ribcage Pain Descriptors / Indicators: Grimacing Pain Intervention(s): Limited activity within patient's tolerance;Monitored during session;Repositioned  Home Living                      Prior Function            PT Goals (current goals can now be found in the care plan section) Acute Rehab PT Goals Patient Stated Goal: to not hurt and go home Progress towards PT goals: Progressing toward goals    Frequency    Min 2X/week      PT Plan Current plan remains appropriate    Co-evaluation              AM-PAC  PT "6 Clicks" Mobility   Outcome Measure  Help needed turning from your back to your side while in a flat bed without using bedrails?: Total Help needed moving from lying on your back to sitting on the side of a flat bed without using bedrails?: Total Help needed moving to and from a bed to a chair (including a wheelchair)?: Total Help needed standing up from a chair using your arms (e.g., wheelchair or bedside chair)?: Total Help needed to walk in hospital room?: Total Help needed climbing 3-5 steps with a railing? : Total 6 Click Score: 6    End of Session   Activity Tolerance: Patient limited by fatigue;Patient limited by pain;Treatment limited secondary to medical complications (Comment) Patient left: in bed;with call bell/phone within reach;with bed alarm set Nurse Communication: Mobility status PT Visit Diagnosis: Unsteadiness on feet (R26.81);Muscle weakness (generalized) (M62.81);Difficulty in walking, not elsewhere classified (R26.2);History of falling (Z91.81) Pain - Right/Left: Left Pain - part of body: Leg;Hip;Knee     Time: 0076-2263 PT Time Calculation (min) (ACUTE ONLY): 19 min  Charges:  $Therapeutic Exercise: 8-22 mins                    Ivar Drape 01/08/2019, 12:38 PM   Samul Dada, PT MS Acute Rehab Dept. Number: [facility] [phone] and [facility] [phone]

## 2019-01-08 NOTE — TOC Transition Note (Signed)
Transition of Care [facility]) - CM/SW Discharge Note **Discharged to [facility] via ambulance    Patient Details  Name: Emily Camacho MRN: [id number] Date of Birth: March 04, 1928  Transition of Care [facility]) CM/SW Contact:  Sable Feil, LCSW Phone Number: 01/08/2019, 3:16 PM   Clinical Narrative: Talked with daughter regarding patient not discharging yesterday and she was aware, was contacted and she tried to talk with her mom, however per daughter, her mom just cried. She and her siblings (4 brothers who live in [place], [place], [place] and [place]) remain in agreement with  [facility] rehab.   MD was aware of patient's refusal on 12/15 and advised CSW that he would talk with patient. Patient's nurse Mariea Clonts also talked with patient and she expressed agreement with going to a facility for ST rehab. Discharge information transmitted to facility.    Final next level of care: [place] Barriers to Discharge: Barriers Resolved(Patient now agreeable to discharging to SNF)   Patient Goals and CMS Choice Patient states their goals for this hospitalization and ongoing recovery are:: Family is aware of patient's refusal to be transported the evening of 12/15 and remain agreeable to ST rehab CMS Medicare.gov Compare Post Acute Care list provided to:: Patient Represenative (must comment)(Daughter provided information regarding [email or web address]) Choice offered to / list presented to : Adult Children  Discharge Placement   Existing PASRR number confirmed : 01/04/19          Patient chooses bed at: (Family chose [facility]) Patient to be transferred to facility by: Non-emergency ambulance Name of family member notified: Daughter Bari Mantis - [phone] Patient and family notified of of transfer: 01/08/19  Discharge Plan and Services In-house Referral: Clinical Social Work Discharge Planning Services: NA Post Acute Care Choice: [facility]          DME Arranged:  N/A DME Agency: NA       HH Arranged: NA HH Agency: NA        Social Determinants of Health (SDOH) Interventions  No SDOH interventions needed prior to discharge   Readmission Risk Interventions No flowsheet data found.

## 2019-01-08 NOTE — Progress Notes (Signed)
MADDEN PIAZZA to be discharged [place] per MD order. Patient verbalized understanding.  Skin clean, dry and intact without evidence of skin break down, no evidence of skin tears noted. IV catheter discontinued intact. Site without signs and symptoms of complications. Dressing and pressure applied. Pt denies pain at the site currently. No complaints noted.  Patient free of lines, drains  Discharge packet assembled. An After Visit Summary (AVS) was printed and given to the EMS personnel. Patient escorted via stretcher and discharged to [facility] via ambulance. Report called to accepting facility; all questions and concerns addressed.   Shela Commons, RN

## 2019-01-08 NOTE — Discharge Summary (Signed)
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 were obtained from the base of the skull through the vertex without intravenous contrast. COMPARISON:  None. FINDINGS: Brain: No evidence of acute infarction, hemorrhage, hydrocephalus, extra-axial collection or mass lesion/mass effect. Symmetric prominence of the ventricles, cisterns and sulci compatible with parenchymal volume loss. Patchy areas of white matter hypoattenuation are most compatible with chronic microvascular angiopathy. Vascular: Atherosclerotic calcification of the carotid siphons and intradural vertebral arteries. No hyperdense vessel. Skull: No calvarial fracture or suspicious osseous lesion. No scalp swelling or hematoma. Sinuses/Orbits: Paranasal sinuses and mastoid air cells are predominantly clear. Orbital structures are unremarkable aside from prior lens extractions. Other: None IMPRESSION: 1. No acute intracranial abnormality.  2. Moderate parenchymal atrophy and chronic microvascular angiopathy. Electronically Signed   By: Lovena Le [name]   On: 12/31/2018 15:35   DG Chest Portable 1 View  Result Date: 01/01/2019 CLINICAL DATA:  ? aspiration, concern for aspiration, rule out pneumonia. EXAM: PORTABLE CHEST 1 VIEW COMPARISON:  Chest radiograph 12/31/2018 FINDINGS: Heart size within normal limits. Aortic atherosclerosis. No evidence of airspace consolidation within the lungs. Please note the left lateral costophrenic angle is not entirely included within the field of view. No pleural effusion or pneumothorax. No acute bony abnormality. Overlying cardiac monitoring leads. IMPRESSION: No airspace consolidation. Electronically Signed   By: Kellie Simmering DO   On: 01/01/2019 07:54   DG Knee Complete 4 Views Right  Result Date: 12/31/2018 CLINICAL DATA:  Altered mental status, found down for 6 days EXAM: RIGHT KNEE - COMPLETE 4+ VIEW COMPARISON:  None. FINDINGS: Moderate to severe tricompartmental osteoarthrosis. Mild patella [place] is  noted. No acute fracture or definite traumatic malalignment. Trace effusion. Vascular calcium noted posterior to the knee. Mild diffuse soft tissue swelling is seen. IMPRESSION: 1. No acute fracture or traumatic malalignment. 2. Moderate to severe tricompartmental osteoarthrosis. 3. Mild patella [place]. 4. Diffuse soft tissue swelling and trace effusion. Electronically Signed   By: Kreg Shropshire [name]   On: 12/31/2018 16:18   DG HIPS BILAT WITH PELVIS 3-4 VIEWS  Result Date: 12/31/2018 CLINICAL DATA:  Found down EXAM: DG HIP (WITH OR WITHOUT PELVIS) 3-4V BILAT COMPARISON:  None. FINDINGS: The osseous structures appear diffusely demineralized which may limit detection of small or nondisplaced fractures. No evidence of acute fracture or traumatic diastasis of the pelvis. There are features of osteonecrosis involving both femoral heads, more severe on the right than the left with associated bony remodeling of  the acetabula. Proximal femora are otherwise intact. Bowel gas pattern is normal. Minimal soft tissue swelling of the right hip. Extensive vascular calcium noted in the pelvis. Sacrum is difficult to fully evaluate given overlying bowel gas and osteopenia. Bowel gas pattern is nonobstructive. IMPRESSION: 1. Findings of osteonecrosis involving both femoral heads, more severe on the right than the left with associated bony remodeling of the acetabula. 2. No acute fracture or traumatic diastasis. Sacrum difficult to evaluate due to overlying bowel gas and diffuse osseous demineralization. Electronically Signed   By: Kreg Shropshire [name]   On: 12/31/2018 16:20   Subjective: Overnight events noted.  Refused to go to SNF last night.  Denies complaints today.  States that she would rather go home to her cat.  However understands that she is weak and needs rehab otherwise at risk for falls and injuries.  Agreeable to go to SNF today.  No acute issues per RN.  Discharge Exam:  Vitals:   01/07/19 1500 01/07/19 2350 01/08/19 0423 01/08/19 0900  BP: (!) 110/48 (!) 100/46 (!) 101/48 (!) 91/46  Pulse: 68 67 63 67  Resp: 16 20 20 18   Temp: 98.9 F (37.2 C) 99.1 F (37.3 C) 98.4 F (36.9 C) 98.3 F (36.8 C)  TempSrc: Oral Oral Oral Oral  SpO2: 98% 95% 94% 94%  Weight:      Height:        General: Elderly female, small built, frail and thinly nourished lying comfortably propped up in bed without distress.  Oral mucosa moist. Cardiovascular: RRR, S1/S2 +, no rubs, no gallops Respiratory: CTA bilaterally, no wheezing, no rhonchi.  No increased work of breathing. Abdominal: Soft, NT, ND, bowel sounds +.  No organomegaly or masses appreciated. Extremities: no edema, no cyanosis.  Moves all extremities symmetrically.  Has bilateral ankle/heel floaters in place. CNS: Alert and oriented to person, place, partly to time and to the president.  No focal neurological deficits   The results of significant diagnostics  from this hospitalization (including imaging, microbiology, ancillary and laboratory) are listed below for reference.     Labs:  Basic Metabolic Panel: Recent Labs  Lab 01/01/19 1745 01/02/19 0444 01/03/19 0339 01/04/19 0546 01/07/19 0551  NA 149* 152* 142 139  --   K 4.5 3.6 3.4* 3.8  --   CL 118* 119* 111 108  --   CO2 22 22 22  21*  --   GLUCOSE 146* 114* 139* 108*  --   BUN 53* 41* 32* 25*  --   CREATININE 1.15* 1.05* 1.17* 1.10* 1.06*  CALCIUM 8.7* 9.1 8.4* 8.2*  --    Liver Function Tests: Recent Labs  Lab 01/02/19 0444 01/03/19 0339 01/04/19 0546  AST 32 30 31  ALT 29 27 20   ALKPHOS 52 46 47  BILITOT 0.4 0.6 0.6  PROT 5.9* 5.1* 5.0*  ALBUMIN 2.5* 2.1* 2.0*   CBC: Recent Labs  Lab 01/02/19 0444 01/03/19 0339 01/04/19 0546 01/07/19 0551  WBC 6.6 8.6 9.9 7.7  HGB 11.2* 9.8* 10.1* 9.9*  HCT 35.8* 30.4* 32.0* 30.4*  MCV 96.0 92.1 94.4 91.8  PLT 241 192 192 272   Urinalysis    Component Value Date/Time   COLORURINE YELLOW 12/31/2018 1420   APPEARANCEUR HAZY (A) 12/31/2018 1420   LABSPEC 1.016 12/31/2018 1420   PHURINE 5.0 12/31/2018 1420   GLUCOSEU NEGATIVE 12/31/2018 1420   HGBUR MODERATE (A) 12/31/2018 1420   BILIRUBINUR NEGATIVE 12/31/2018 1420   KETONESUR 20 (A) 12/31/2018 1420   PROTEINUR NEGATIVE 12/31/2018 1420   UROBILINOGEN 0.2 07/13/2009 1622   NITRITE NEGATIVE 12/31/2018 1420   LEUKOCYTESUR NEGATIVE 12/31/2018 1420   Microbiology Recent Results (from the past 240 hour(s))  Blood culture (routine x 2)     Status: Abnormal   Collection Time: 12/31/18  2:05 PM   Specimen: BLOOD  Result Value Ref Range Status   Specimen Description BLOOD LEFT ANTECUBITAL  Final   Special Requests   Final    BOTTLES DRAWN AEROBIC AND ANAEROBIC Blood Culture adequate volume   Culture  Setup Time   Final    GRAM POSITIVE COCCI IN CLUSTERS IN BOTH AEROBIC AND ANAEROBIC BOTTLES CRITICAL RESULT CALLED TO, READ BACK BY AND VERIFIED WITH: E. SINCLAIR PHARMD,  AT 1047 01/01/19 BY D. VANHOOK    Culture (A)  Final    STAPHYLOCOCCUS SPECIES (COAGULASE NEGATIVE) THE SIGNIFICANCE OF ISOLATING THIS ORGANISM FROM A SINGLE SET OF BLOOD CULTURES WHEN MULTIPLE SETS ARE DRAWN IS UNCERTAIN. PLEASE NOTIFY THE MICROBIOLOGY DEPARTMENT WITHIN ONE WEEK IF SPECIATION AND SENSITIVITIES ARE REQUIRED. PROTEUS MIRABILIS SUSCEPTIBILITIES PERFORMED ON PREVIOUS CULTURE WITHIN THE LAST 5 DAYS. Performed at [facility], [address] [place], [place] [address]    Report Status 01/03/2019 FINAL  Final  Blood culture (routine x 2)     Status: Abnormal   Collection Time: 12/31/18  2:20 PM   Specimen: BLOOD  Result Value Ref Range Status   Specimen Description BLOOD RIGHT ANTECUBITAL  Final   Special Requests   Final    BOTTLES DRAWN AEROBIC AND ANAEROBIC Blood Culture adequate volume   Culture  Setup Time   Final    GRAM NEGATIVE RODS IN BOTH AEROBIC AND ANAEROBIC BOTTLES CRITICAL RESULT CALLED TO, READ BACK BY AND VERIFIED WITH: Norva RiffleE. Sinclair PharmD 11:55 01/01/19 ([email or web address]) Performed at [facility], [address] [place], [place] [address]    Culture PROTEUS MIRABILIS (A)  Final   Report Status 01/03/2019 FINAL  Final   Organism ID, Bacteria PROTEUS MIRABILIS  Final      Susceptibility   Proteus mirabilis - MIC*    AMPICILLIN <=2 SENSITIVE Sensitive     CEFAZOLIN <=4 SENSITIVE Sensitive     CEFEPIME <=1 SENSITIVE Sensitive     CEFTAZIDIME <=1 SENSITIVE Sensitive     CEFTRIAXONE <=1 SENSITIVE Sensitive     CIPROFLOXACIN <=0.25 SENSITIVE Sensitive     GENTAMICIN <=1 SENSITIVE Sensitive     IMIPENEM 4 SENSITIVE Sensitive     TRIMETH/SULFA <=20 SENSITIVE Sensitive     AMPICILLIN/SULBACTAM <=2 SENSITIVE Sensitive     PIP/TAZO <=4 SENSITIVE Sensitive     * PROTEUS MIRABILIS  Blood Culture ID Panel (Reflexed)     Status: Abnormal   Collection Time: 12/31/18  2:20 PM  Result Value Ref Range Status   Enterococcus species NOT DETECTED NOT DETECTED Final    Listeria monocytogenes NOT DETECTED NOT DETECTED Final   Staphylococcus species NOT DETECTED NOT DETECTED Final   Staphylococcus aureus (BCID) NOT DETECTED NOT DETECTED Final   Streptococcus species NOT DETECTED NOT DETECTED Final   Streptococcus agalactiae NOT DETECTED NOT DETECTED Final   Streptococcus pneumoniae NOT DETECTED NOT DETECTED Final   Streptococcus pyogenes NOT DETECTED NOT DETECTED Final   Acinetobacter baumannii NOT DETECTED NOT DETECTED Final   Enterobacteriaceae species DETECTED (A) NOT DETECTED Final    Comment: Enterobacteriaceae represent a large family of gram-negative bacteria, not a single organism. CRITICAL RESULT CALLED TO, READ BACK BY AND VERIFIED WITH: Norva Riffle PharmD 11:55 01/01/19 ([email or web address])    Enterobacter cloacae complex NOT DETECTED NOT DETECTED Final   Escherichia coli NOT DETECTED NOT DETECTED Final   Klebsiella oxytoca NOT DETECTED NOT DETECTED Final   Klebsiella pneumoniae NOT DETECTED NOT DETECTED Final   Proteus species DETECTED (A) NOT DETECTED Final    Comment: CRITICAL RESULT CALLED TO, READ BACK BY AND VERIFIED WITH: Norva Riffle PharmD 11:55 01/01/19 ([email or web address])    Serratia marcescens NOT DETECTED NOT DETECTED Final   Carbapenem resistance NOT DETECTED NOT DETECTED Final   Haemophilus influenzae NOT DETECTED NOT DETECTED Final   Neisseria meningitidis NOT DETECTED NOT DETECTED Final   Pseudomonas aeruginosa NOT DETECTED NOT DETECTED Final   Candida albicans NOT DETECTED NOT DETECTED Final   Candida glabrata NOT DETECTED NOT DETECTED Final   Candida krusei NOT DETECTED NOT DETECTED Final   Candida parapsilosis NOT DETECTED NOT DETECTED Final   Candida tropicalis NOT DETECTED NOT DETECTED Final    Comment: Performed at [facility], [address], [place], [place] [address]  SARS CORONAVIRUS 2 (TAT 6-24 HRS) Nasopharyngeal Nasopharyngeal Swab     Status: None   Collection Time: 12/31/18  4:07 PM   Specimen: Nasopharyngeal Swab   Result Value Ref Range Status   SARS Coronavirus 2 NEGATIVE NEGATIVE Final    Comment: (NOTE) SARS-CoV-2 target nucleic acids are NOT DETECTED. The SARS-CoV-2 RNA is generally detectable in upper and lower respiratory specimens during the acute phase of infection. Negative results do not preclude SARS-CoV-2 infection, do not rule out co-infections with other pathogens, and should not be used as the sole basis for treatment or other patient management decisions. Negative results must be combined with clinical observations, patient history, and epidemiological information. The expected result is Negative. Fact Sheet for Patients: HairSlick.no Fact Sheet for Healthcare Providers: quierodirigir.com This test is not yet approved or cleared by the [place] FDA and  has been authorized for detection and/or diagnosis of SARS-CoV-2 by FDA under an Emergency Use Authorization (EUA). This EUA will remain  in effect (meaning this test can be used) for the duration of the COVID-19 declaration under Section 56 4(b)(1) of the Act, 21 U.S.C. section 360bbb-3(b)(1), unless the authorization is terminated or revoked sooner. Performed at [facility], [address], [place], [place] [address]   SARS CORONAVIRUS 2 (TAT 6-24 HRS) Nasopharyngeal Nasopharyngeal Swab     Status: None   Collection Time: 01/07/19  8:53 AM   Specimen: Nasopharyngeal Swab  Result Value Ref Range Status   SARS Coronavirus 2 NEGATIVE NEGATIVE Final    Comment: (NOTE) SARS-CoV-2 target nucleic acids are NOT DETECTED. The SARS-CoV-2  RNA is generally detectable in upper and lower respiratory specimens during the acute phase of infection. Negative results do not preclude SARS-CoV-2 infection, do not rule out co-infections with other pathogens, and should not be used as the sole basis for treatment or other patient management decisions. Negative results must be  combined with clinical observations, patient history, and epidemiological information. The expected result is Negative. Fact Sheet for Patients: HairSlick.nohttps://www.fda.gov/media/138098/download Fact Sheet for Healthcare Providers: quierodirigir.comhttps://www.fda.gov/media/138095/download This test is not yet approved or cleared by the [place] FDA and  has been authorized for detection and/or diagnosis of SARS-CoV-2 by FDA under an Emergency Use Authorization (EUA). This EUA will remain  in effect (meaning this test can be used) for the duration of the COVID-19 declaration under Section 56 4(b)(1) of the Act, 21 U.S.C. section 360bbb-3(b)(1), unless the authorization is terminated or revoked sooner. Performed at [facility], [address] [place], [place] [address]    I discussed in detail with patient's daughter, updated care and answered questions. She reportedly lives in poor living conditions, declines assistance, refuses to wear alert bracelet and does not listen to their advice, does not want to go live with them even though they have offered multiple times.  I advised daughter that patient does have capacity to make medical decisions for herself.  Time coordinating discharge: 30 minutes  Marcellus ScottAnand Liticia Gasior, MD, [place]ACP, [facility]. [facility]  To contact the attending provider between 7A-7P or the covering provider during after hours 7P-7A, please log into the web site [email or web address] and access using universal [place] password for that web site. If you do not have the password, please call the hospital operator.

## 2019-01-08 NOTE — Discharge Instructions (Signed)

## 2019-01-08 NOTE — Care Management Important Message (Signed)
Important Message  Patient Details  Name: Emily Camacho MRN: [id number] Date of Birth: 09-08-1928   Medicare Important Message Given:  Yes     Jordynn Perrier 01/08/2019, 12:56 PM

## 2019-02-06 ENCOUNTER — Encounter: Payer: PRIVATE HEALTH INSURANCE | Admitting: Obstetrics & Gynecology

## 2020-02-28 ENCOUNTER — Other Ambulatory Visit: Payer: Self-pay

## 2020-02-28 ENCOUNTER — Emergency Department (HOSPITAL_COMMUNITY): Payer: Medicare Other

## 2020-02-28 ENCOUNTER — Encounter (HOSPITAL_COMMUNITY): Payer: Self-pay | Admitting: *Deleted

## 2020-02-28 ENCOUNTER — Emergency Department (HOSPITAL_COMMUNITY)
Admission: EM | Admit: 2020-02-28 | Discharge: 2020-02-29 | Disposition: A | Discharge status: Skilled Nursing Facility | Payer: Medicare Other | Attending: Emergency Medicine | Admitting: Emergency Medicine

## 2020-02-28 DIAGNOSIS — Z20822 Contact with and (suspected) exposure to covid-19: Secondary | ICD-10-CM | POA: Insufficient documentation

## 2020-02-28 DIAGNOSIS — Z7982 Long term (current) use of aspirin: Secondary | ICD-10-CM | POA: Diagnosis not present

## 2020-02-28 DIAGNOSIS — I509 Heart failure, unspecified: Secondary | ICD-10-CM | POA: Insufficient documentation

## 2020-02-28 DIAGNOSIS — Z79899 Other long term (current) drug therapy: Secondary | ICD-10-CM | POA: Insufficient documentation

## 2020-02-28 DIAGNOSIS — R112 Nausea with vomiting, unspecified: Secondary | ICD-10-CM | POA: Diagnosis not present

## 2020-02-28 DIAGNOSIS — Z91040 Latex allergy status: Secondary | ICD-10-CM | POA: Insufficient documentation

## 2020-02-28 DIAGNOSIS — I110 Hypertensive heart disease with heart failure: Secondary | ICD-10-CM | POA: Diagnosis not present

## 2020-02-28 DIAGNOSIS — R410 Disorientation, unspecified: Secondary | ICD-10-CM | POA: Insufficient documentation

## 2020-02-28 DIAGNOSIS — R0789 Other chest pain: Secondary | ICD-10-CM | POA: Insufficient documentation

## 2020-02-28 DIAGNOSIS — R1110 Vomiting, unspecified: Secondary | ICD-10-CM

## 2020-02-28 HISTORY — DX: Heart failure, unspecified: I50.9

## 2020-02-28 LAB — CBC WITH DIFFERENTIAL/PLATELET
Abs Immature Granulocytes: 0.03 10*3/uL (ref 0.00–0.07)
Basophils Absolute: 0.1 10*3/uL (ref 0.0–0.1)
Basophils Relative: 1 %
Eosinophils Absolute: 0.1 10*3/uL (ref 0.0–0.5)
Eosinophils Relative: 1 %
HCT: 30.2 % — ABNORMAL LOW (ref 36.0–46.0)
Hemoglobin: 9.9 g/dL — ABNORMAL LOW (ref 12.0–15.0)
Immature Granulocytes: 0 %
Lymphocytes Relative: 10 %
Lymphs Abs: 0.8 10*3/uL (ref 0.7–4.0)
MCH: 28.8 pg (ref 26.0–34.0)
MCHC: 32.8 g/dL (ref 30.0–36.0)
MCV: 87.8 fL (ref 80.0–100.0)
Monocytes Absolute: 0.5 10*3/uL (ref 0.1–1.0)
Monocytes Relative: 6 %
Neutro Abs: 7.1 10*3/uL (ref 1.7–7.7)
Neutrophils Relative %: 82 %
Platelets: 367 10*3/uL (ref 150–400)
RBC: 3.44 MIL/uL — ABNORMAL LOW (ref 3.87–5.11)
RDW: 16.7 % — ABNORMAL HIGH (ref 11.5–15.5)
WBC: 8.6 10*3/uL (ref 4.0–10.5)
nRBC: 0 % (ref 0.0–0.2)
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
## 2020-02-28 NOTE — ED Triage Notes (Signed)
Pt arrived from [place] and has been having altered mental status since early am.  Pt was having N/V and vomiting dark brown emesis.  Pt has a pulsating area to left chest per ems, showed area.  Pt had temp 102.1. Pt was on RA at 91% and EMS placed on 02/2L and increased to 96%.  BP 190/100. Pt is alert and knows name, denies pain and states she feels some better.

## 2020-02-28 NOTE — ED Notes (Signed)
Dondra Spry, pt daughter [phone] would like an update

## 2020-02-29 DIAGNOSIS — R112 Nausea with vomiting, unspecified: Secondary | ICD-10-CM | POA: Diagnosis not present

## 2020-02-29 LAB — COMPREHENSIVE METABOLIC PANEL
ALT: 8 U/L (ref 0–44)
AST: 18 U/L (ref 15–41)
Albumin: 2.7 g/dL — ABNORMAL LOW (ref 3.5–5.0)
Alkaline Phosphatase: 56 U/L (ref 38–126)
Anion gap: 9 (ref 5–15)
BUN: 16 mg/dL (ref 8–23)
CO2: 30 mmol/L (ref 22–32)
Calcium: 8.9 mg/dL (ref 8.9–10.3)
Chloride: 103 mmol/L (ref 98–111)
Creatinine, Ser: 0.69 mg/dL (ref 0.44–1.00)
GFR, Estimated: >60 mL/min (ref >60)
Glucose, Bld: 139 mg/dL — ABNORMAL HIGH (ref 70–99)
Potassium: 3.2 mmol/L — ABNORMAL LOW (ref 3.5–5.1)
Sodium: 142 mmol/L (ref 135–145)
Total Bilirubin: 0.6 mg/dL (ref 0.3–1.2)
Total Protein: 6.8 g/dL (ref 6.5–8.1)

## 2020-02-29 LAB — URINALYSIS, ROUTINE W REFLEX MICROSCOPIC
Bilirubin Urine: NEGATIVE
Glucose, UA: NEGATIVE mg/dL
Hgb urine dipstick: NEGATIVE
Ketones, ur: NEGATIVE mg/dL
Leukocytes,Ua: NEGATIVE
Nitrite: NEGATIVE
Protein, ur: 30 mg/dL — AB
Specific Gravity, Urine: 1.019 (ref 1.005–1.030)
Squamous Epithelial / HPF: >50 — ABNORMAL HIGH (ref 0–5)
pH: 6 (ref 5.0–8.0)

## 2020-02-29 LAB — PROTIME-INR
INR: 1.1 (ref 0.8–1.2)
Prothrombin Time: 14 seconds (ref 11.4–15.2)

## 2020-02-29 LAB — POC SARS CORONAVIRUS 2 AG -  ED: SARS Coronavirus 2 Ag: NEGATIVE

## 2020-02-29 LAB — LACTIC ACID, PLASMA: Lactic Acid, Venous: 1.5 mmol/L (ref 0.5–1.9)

## 2020-02-29 LAB — APTT: aPTT: 33 seconds (ref 24–36)

## 2020-02-29 MED ORDER — AZITHROMYCIN 250 MG PO TABS: 250.0000 mg | ORAL_TABLET | Freq: Every day | ORAL | 0 refills | Status: DC

## 2020-02-29 MED ORDER — ONDANSETRON 4 MG PO TBDP: 4.0000 mg | ORAL_TABLET | Freq: Three times a day (TID) | ORAL | 0 refills | Status: AC | PRN

## 2020-02-29 NOTE — ED Notes (Signed)
Updated both Staci Acosta on pt status.

## 2020-02-29 NOTE — ED Notes (Signed)
PTAR CALLED  °

## 2020-03-01 LAB — URINE CULTURE: Culture: NO GROWTH

## 2020-03-05 LAB — CULTURE, BLOOD (SINGLE)
Culture: NO GROWTH
Special Requests: ADEQUATE

## 2021-02-22 ENCOUNTER — Emergency Department (HOSPITAL_COMMUNITY): Payer: Medicare Other

## 2021-02-22 ENCOUNTER — Inpatient Hospital Stay (HOSPITAL_COMMUNITY): Payer: Medicare Other | Admitting: Anesthesiology

## 2021-02-22 ENCOUNTER — Encounter (HOSPITAL_COMMUNITY): Payer: Self-pay | Admitting: Internal Medicine

## 2021-02-22 ENCOUNTER — Other Ambulatory Visit: Payer: Self-pay

## 2021-02-22 ENCOUNTER — Inpatient Hospital Stay (HOSPITAL_COMMUNITY): Payer: Medicare Other

## 2021-02-22 ENCOUNTER — Encounter (HOSPITAL_COMMUNITY)
Admission: EM | Disposition: A | Discharge status: Skilled Nursing Facility | Payer: Self-pay | Source: Skilled Nursing Facility | Attending: Internal Medicine

## 2021-02-22 ENCOUNTER — Inpatient Hospital Stay (HOSPITAL_COMMUNITY)
Admission: EM | Admit: 2021-02-22 | Discharge: 2021-02-28 | DRG: 480 | Disposition: A | Discharge status: Skilled Nursing Facility | Payer: Medicare Other | Source: Skilled Nursing Facility | Attending: Internal Medicine | Admitting: Internal Medicine

## 2021-02-22 DIAGNOSIS — I130 Hypertensive heart and chronic kidney disease with heart failure and stage 1 through stage 4 chronic kidney disease, or unspecified chronic kidney disease: Secondary | ICD-10-CM | POA: Diagnosis present

## 2021-02-22 DIAGNOSIS — Z419 Encounter for procedure for purposes other than remedying health state, unspecified: Secondary | ICD-10-CM

## 2021-02-22 DIAGNOSIS — M80051A Age-related osteoporosis with current pathological fracture, right femur, initial encounter for fracture: Secondary | ICD-10-CM | POA: Diagnosis present

## 2021-02-22 DIAGNOSIS — Z66 Do not resuscitate: Secondary | ICD-10-CM | POA: Diagnosis present

## 2021-02-22 DIAGNOSIS — W19XXXD Unspecified fall, subsequent encounter: Secondary | ICD-10-CM | POA: Diagnosis not present

## 2021-02-22 DIAGNOSIS — Z880 Allergy status to penicillin: Secondary | ICD-10-CM | POA: Diagnosis not present

## 2021-02-22 DIAGNOSIS — S7290XA Unspecified fracture of unspecified femur, initial encounter for closed fracture: Secondary | ICD-10-CM | POA: Diagnosis present

## 2021-02-22 DIAGNOSIS — S72343A Displaced spiral fracture of shaft of unspecified femur, initial encounter for closed fracture: Secondary | ICD-10-CM

## 2021-02-22 DIAGNOSIS — D696 Thrombocytopenia, unspecified: Secondary | ICD-10-CM | POA: Diagnosis present

## 2021-02-22 DIAGNOSIS — Z7401 Bed confinement status: Secondary | ICD-10-CM | POA: Diagnosis not present

## 2021-02-22 DIAGNOSIS — W19XXXA Unspecified fall, initial encounter: Secondary | ICD-10-CM

## 2021-02-22 DIAGNOSIS — S72402D Unspecified fracture of lower end of left femur, subsequent encounter for closed fracture with routine healing: Secondary | ICD-10-CM | POA: Diagnosis not present

## 2021-02-22 DIAGNOSIS — M80052A Age-related osteoporosis with current pathological fracture, left femur, initial encounter for fracture: Secondary | ICD-10-CM | POA: Diagnosis present

## 2021-02-22 DIAGNOSIS — N179 Acute kidney failure, unspecified: Secondary | ICD-10-CM | POA: Diagnosis present

## 2021-02-22 DIAGNOSIS — S72302A Unspecified fracture of shaft of left femur, initial encounter for closed fracture: Secondary | ICD-10-CM | POA: Diagnosis present

## 2021-02-22 DIAGNOSIS — D62 Acute posthemorrhagic anemia: Secondary | ICD-10-CM | POA: Diagnosis not present

## 2021-02-22 DIAGNOSIS — E876 Hypokalemia: Secondary | ICD-10-CM | POA: Diagnosis present

## 2021-02-22 DIAGNOSIS — Z79899 Other long term (current) drug therapy: Secondary | ICD-10-CM

## 2021-02-22 DIAGNOSIS — T148XXA Other injury of unspecified body region, initial encounter: Secondary | ICD-10-CM

## 2021-02-22 DIAGNOSIS — I5042 Chronic combined systolic (congestive) and diastolic (congestive) heart failure: Secondary | ICD-10-CM | POA: Diagnosis present

## 2021-02-22 DIAGNOSIS — E785 Hyperlipidemia, unspecified: Secondary | ICD-10-CM

## 2021-02-22 DIAGNOSIS — S72341A Displaced spiral fracture of shaft of right femur, initial encounter for closed fracture: Secondary | ICD-10-CM | POA: Diagnosis present

## 2021-02-22 DIAGNOSIS — Z7982 Long term (current) use of aspirin: Secondary | ICD-10-CM

## 2021-02-22 DIAGNOSIS — M8000XA Age-related osteoporosis with current pathological fracture, unspecified site, initial encounter for fracture: Secondary | ICD-10-CM

## 2021-02-22 DIAGNOSIS — I10 Essential (primary) hypertension: Secondary | ICD-10-CM

## 2021-02-22 DIAGNOSIS — Z20822 Contact with and (suspected) exposure to covid-19: Secondary | ICD-10-CM | POA: Diagnosis present

## 2021-02-22 DIAGNOSIS — S72402A Unspecified fracture of lower end of left femur, initial encounter for closed fracture: Secondary | ICD-10-CM | POA: Diagnosis present

## 2021-02-22 DIAGNOSIS — S72341D Displaced spiral fracture of shaft of right femur, subsequent encounter for closed fracture with routine healing: Secondary | ICD-10-CM | POA: Diagnosis not present

## 2021-02-22 DIAGNOSIS — F0390 Unspecified dementia without behavioral disturbance: Secondary | ICD-10-CM | POA: Diagnosis present

## 2021-02-22 DIAGNOSIS — Y92129 Unspecified place in nursing home as the place of occurrence of the external cause: Secondary | ICD-10-CM | POA: Diagnosis not present

## 2021-02-22 DIAGNOSIS — R739 Hyperglycemia, unspecified: Secondary | ICD-10-CM | POA: Diagnosis not present

## 2021-02-22 DIAGNOSIS — Z7189 Other specified counseling: Secondary | ICD-10-CM

## 2021-02-22 DIAGNOSIS — Z79890 Hormone replacement therapy (postmenopausal): Secondary | ICD-10-CM

## 2021-02-22 DIAGNOSIS — Z882 Allergy status to sulfonamides status: Secondary | ICD-10-CM | POA: Diagnosis not present

## 2021-02-22 DIAGNOSIS — Z91040 Latex allergy status: Secondary | ICD-10-CM | POA: Diagnosis not present

## 2021-02-22 DIAGNOSIS — Q899 Congenital malformation, unspecified: Secondary | ICD-10-CM

## 2021-02-22 DIAGNOSIS — E039 Hypothyroidism, unspecified: Secondary | ICD-10-CM | POA: Diagnosis present

## 2021-02-22 DIAGNOSIS — Z515 Encounter for palliative care: Secondary | ICD-10-CM | POA: Diagnosis not present

## 2021-02-22 DIAGNOSIS — K219 Gastro-esophageal reflux disease without esophagitis: Secondary | ICD-10-CM | POA: Diagnosis present

## 2021-02-22 DIAGNOSIS — N1831 Chronic kidney disease, stage 3a: Secondary | ICD-10-CM | POA: Diagnosis present

## 2021-02-22 HISTORY — PX: FEMUR IM NAIL: SHX1597

## 2021-02-22 LAB — CBC WITH DIFFERENTIAL/PLATELET
Abs Immature Granulocytes: 0.06 10*3/uL (ref 0.00–0.07)
Basophils Absolute: 0 10*3/uL (ref 0.0–0.1)
Basophils Relative: 0 %
Eosinophils Absolute: 0.2 10*3/uL (ref 0.0–0.5)
Eosinophils Relative: 2 %
HCT: 33.7 % — ABNORMAL LOW (ref 36.0–46.0)
Hemoglobin: 11 g/dL — ABNORMAL LOW (ref 12.0–15.0)
Immature Granulocytes: 1 %
Lymphocytes Relative: 16 %
Lymphs Abs: 1.6 10*3/uL (ref 0.7–4.0)
MCH: 30.2 pg (ref 26.0–34.0)
MCHC: 32.6 g/dL (ref 30.0–36.0)
MCV: 92.6 fL (ref 80.0–100.0)
Monocytes Absolute: 0.5 10*3/uL (ref 0.1–1.0)
Monocytes Relative: 5 %
Neutro Abs: 7.8 10*3/uL — ABNORMAL HIGH (ref 1.7–7.7)
Neutrophils Relative %: 76 %
Platelets: 313 10*3/uL (ref 150–400)
RBC: 3.64 MIL/uL — ABNORMAL LOW (ref 3.87–5.11)
RDW: 13.8 % (ref 11.5–15.5)
WBC: 10.1 10*3/uL (ref 4.0–10.5)
nRBC: 0 % (ref 0.0–0.2)

## 2021-02-22 LAB — RESP PANEL BY RT-PCR (FLU A&B, COVID) ARPGX2
Influenza A by PCR: NEGATIVE
Influenza B by PCR: NEGATIVE
SARS Coronavirus 2 by RT PCR: NEGATIVE

## 2021-02-22 LAB — BASIC METABOLIC PANEL
Anion gap: 12 (ref 5–15)
BUN: 22 mg/dL (ref 8–23)
CO2: 22 mmol/L (ref 22–32)
Calcium: 8.8 mg/dL — ABNORMAL LOW (ref 8.9–10.3)
Chloride: 103 mmol/L (ref 98–111)
Creatinine, Ser: 0.92 mg/dL (ref 0.44–1.00)
GFR, Estimated: 59 mL/min — ABNORMAL LOW (ref >60)
Glucose, Bld: 202 mg/dL — ABNORMAL HIGH (ref 70–99)
Potassium: 3.1 mmol/L — ABNORMAL LOW (ref 3.5–5.1)
Sodium: 137 mmol/L (ref 135–145)

## 2021-02-22 LAB — PREPARE RBC (CROSSMATCH)

## 2021-02-22 LAB — HEMOGLOBIN AND HEMATOCRIT, BLOOD
HCT: 18.9 % — ABNORMAL LOW (ref 36.0–46.0)
Hemoglobin: 5.9 g/dL — CL (ref 12.0–15.0)

## 2021-02-22 LAB — ABO/RH: ABO/RH(D): A POS

## 2021-02-22 SURGERY — INSERTION, INTRAMEDULLARY ROD, FEMUR, RETROGRADE
Anesthesia: General | Laterality: Bilateral

## 2021-02-22 MED ORDER — LEVOTHYROXINE SODIUM 25 MCG PO TABS
25.0000 ug | ORAL_TABLET | Freq: Every day | ORAL | Status: DC
  Administered 2021-02-23 – 2021-02-28 (×6): 25 ug via ORAL
  Filled 2021-02-22 (×6): qty 1

## 2021-02-22 MED ORDER — LIDOCAINE 2% (20 MG/ML) 5 ML SYRINGE
INTRAMUSCULAR | Status: DC | PRN
  Administered 2021-02-22: 40 mg via INTRAVENOUS

## 2021-02-22 MED ORDER — CHLORHEXIDINE GLUCONATE 0.12 % MT SOLN: 15.0000 mL | Freq: Once | OROMUCOSAL | Status: AC

## 2021-02-22 MED ORDER — DEXAMETHASONE SODIUM PHOSPHATE 10 MG/ML IJ SOLN
INTRAMUSCULAR | Status: DC | PRN
  Administered 2021-02-22: 5 mg via INTRAVENOUS

## 2021-02-22 MED ORDER — ACETAMINOPHEN 10 MG/ML IV SOLN
INTRAVENOUS | Status: AC
  Filled 2021-02-22: qty 100

## 2021-02-22 MED ORDER — PROPOFOL 10 MG/ML IV BOLUS
INTRAVENOUS | Status: AC
  Filled 2021-02-22: qty 20

## 2021-02-22 MED ORDER — ACETAMINOPHEN 10 MG/ML IV SOLN
INTRAVENOUS | Status: DC | PRN
  Administered 2021-02-22: 1000 mg via INTRAVENOUS

## 2021-02-22 MED ORDER — FENTANYL CITRATE (PF) 100 MCG/2ML IJ SOLN: 25.0000 ug | INTRAMUSCULAR | Status: DC | PRN

## 2021-02-22 MED ORDER — ROCURONIUM BROMIDE 10 MG/ML (PF) SYRINGE
PREFILLED_SYRINGE | INTRAVENOUS | Status: DC | PRN
  Administered 2021-02-22 (×2): 40 mg via INTRAVENOUS

## 2021-02-22 MED ORDER — LACTATED RINGERS IV SOLN: INTRAVENOUS | Status: DC

## 2021-02-22 MED ORDER — SODIUM CHLORIDE 0.9 % IV SOLN: 10.0000 mL/h | Freq: Once | INTRAVENOUS | Status: DC

## 2021-02-22 MED ORDER — ENOXAPARIN SODIUM 40 MG/0.4ML IJ SOSY: 40.0000 mg | PREFILLED_SYRINGE | INTRAMUSCULAR | Status: DC

## 2021-02-22 MED ORDER — VANCOMYCIN HCL IN DEXTROSE 1-5 GM/200ML-% IV SOLN
1000.0000 mg | INTRAVENOUS | Status: AC
  Administered 2021-02-22: 1000 mg via INTRAVENOUS
  Filled 2021-02-22 (×2): qty 200

## 2021-02-22 MED ORDER — POVIDONE-IODINE 10 % EX SWAB
2.0000 "application " | Freq: Once | CUTANEOUS | Status: AC
  Administered 2021-02-22: 2 via TOPICAL

## 2021-02-22 MED ORDER — EPHEDRINE SULFATE-NACL 50-0.9 MG/10ML-% IV SOSY
PREFILLED_SYRINGE | INTRAVENOUS | Status: DC | PRN
  Administered 2021-02-22: 5 mg via INTRAVENOUS
  Administered 2021-02-22: 10 mg via INTRAVENOUS
  Administered 2021-02-22 (×2): 5 mg via INTRAVENOUS

## 2021-02-22 MED ORDER — ONDANSETRON HCL 4 MG/2ML IJ SOLN: 4.0000 mg | Freq: Four times a day (QID) | INTRAMUSCULAR | Status: DC | PRN

## 2021-02-22 MED ORDER — CHLORHEXIDINE GLUCONATE 4 % EX LIQD: 60.0000 mL | Freq: Once | CUTANEOUS | Status: DC

## 2021-02-22 MED ORDER — ENOXAPARIN SODIUM 30 MG/0.3ML IJ SOSY
30.0000 mg | PREFILLED_SYRINGE | INTRAMUSCULAR | Status: DC
  Administered 2021-02-23: 30 mg via SUBCUTANEOUS
  Filled 2021-02-22: qty 0.3

## 2021-02-22 MED ORDER — OXYBUTYNIN CHLORIDE ER 5 MG PO TB24
5.0000 mg | ORAL_TABLET | Freq: Every day | ORAL | Status: DC
  Administered 2021-02-23 – 2021-02-28 (×6): 5 mg via ORAL
  Filled 2021-02-22 (×6): qty 1

## 2021-02-22 MED ORDER — CHLORHEXIDINE GLUCONATE 0.12 % MT SOLN
OROMUCOSAL | Status: AC
  Administered 2021-02-22: 15 mL via OROMUCOSAL
  Filled 2021-02-22: qty 15

## 2021-02-22 MED ORDER — LIDOCAINE 2% (20 MG/ML) 5 ML SYRINGE
INTRAMUSCULAR | Status: AC
  Filled 2021-02-22: qty 5

## 2021-02-22 MED ORDER — METOCLOPRAMIDE HCL 10 MG PO TABS: 5.0000 mg | ORAL_TABLET | Freq: Three times a day (TID) | ORAL | Status: DC | PRN

## 2021-02-22 MED ORDER — FENTANYL CITRATE (PF) 250 MCG/5ML IJ SOLN
INTRAMUSCULAR | Status: AC
  Filled 2021-02-22: qty 5

## 2021-02-22 MED ORDER — HYDROCODONE-ACETAMINOPHEN 5-325 MG PO TABS
1.0000 | ORAL_TABLET | Freq: Four times a day (QID) | ORAL | Status: DC | PRN
  Administered 2021-02-23 – 2021-02-28 (×5): 1 via ORAL
  Filled 2021-02-22 (×5): qty 1

## 2021-02-22 MED ORDER — DOCUSATE SODIUM 100 MG PO CAPS
100.0000 mg | ORAL_CAPSULE | Freq: Two times a day (BID) | ORAL | Status: DC
  Administered 2021-02-23 – 2021-02-28 (×9): 100 mg via ORAL
  Filled 2021-02-22 (×10): qty 1

## 2021-02-22 MED ORDER — ONDANSETRON HCL 4 MG/2ML IJ SOLN
INTRAMUSCULAR | Status: AC
  Filled 2021-02-22: qty 2

## 2021-02-22 MED ORDER — PROPOFOL 10 MG/ML IV BOLUS
INTRAVENOUS | Status: DC | PRN
  Administered 2021-02-22: 60 mg via INTRAVENOUS

## 2021-02-22 MED ORDER — PHENYLEPHRINE 40 MCG/ML (10ML) SYRINGE FOR IV PUSH (FOR BLOOD PRESSURE SUPPORT)
PREFILLED_SYRINGE | INTRAVENOUS | Status: AC
  Filled 2021-02-22: qty 10

## 2021-02-22 MED ORDER — EPHEDRINE 5 MG/ML INJ
INTRAVENOUS | Status: AC
  Filled 2021-02-22: qty 5

## 2021-02-22 MED ORDER — MENTHOL 3 MG MT LOZG: 1.0000 | LOZENGE | OROMUCOSAL | Status: DC | PRN

## 2021-02-22 MED ORDER — PHENYLEPHRINE HCL-NACL 20-0.9 MG/250ML-% IV SOLN
INTRAVENOUS | Status: DC | PRN
  Administered 2021-02-22: 60 ug/min via INTRAVENOUS

## 2021-02-22 MED ORDER — ZINC OXIDE 20 % EX OINT: 1.0000 "application " | TOPICAL_OINTMENT | CUTANEOUS | Status: DC | PRN

## 2021-02-22 MED ORDER — METOCLOPRAMIDE HCL 5 MG/ML IJ SOLN: 5.0000 mg | Freq: Three times a day (TID) | INTRAMUSCULAR | Status: DC | PRN

## 2021-02-22 MED ORDER — FENTANYL CITRATE (PF) 250 MCG/5ML IJ SOLN
INTRAMUSCULAR | Status: DC | PRN
  Administered 2021-02-22 (×3): 25 ug via INTRAVENOUS

## 2021-02-22 MED ORDER — CLINDAMYCIN PHOSPHATE 600 MG/50ML IV SOLN
600.0000 mg | Freq: Four times a day (QID) | INTRAVENOUS | Status: AC
  Administered 2021-02-23: 600 mg via INTRAVENOUS
  Filled 2021-02-22 (×2): qty 50

## 2021-02-22 MED ORDER — DEXAMETHASONE SODIUM PHOSPHATE 10 MG/ML IJ SOLN
INTRAMUSCULAR | Status: AC
  Filled 2021-02-22: qty 1

## 2021-02-22 MED ORDER — TRANEXAMIC ACID-NACL 1000-0.7 MG/100ML-% IV SOLN
1000.0000 mg | Freq: Once | INTRAVENOUS | Status: AC
  Administered 2021-02-23: 1000 mg via INTRAVENOUS
  Filled 2021-02-22 (×2): qty 100

## 2021-02-22 MED ORDER — PHENOL 1.4 % MT LIQD: 1.0000 | OROMUCOSAL | Status: DC | PRN

## 2021-02-22 MED ORDER — ONDANSETRON HCL 4 MG PO TABS: 4.0000 mg | ORAL_TABLET | Freq: Four times a day (QID) | ORAL | Status: DC | PRN

## 2021-02-22 MED ORDER — ENSURE ENLIVE PO LIQD
237.0000 mL | Freq: Three times a day (TID) | ORAL | Status: DC
  Administered 2021-02-23 – 2021-02-28 (×15): 237 mL via ORAL

## 2021-02-22 MED ORDER — ORAL CARE MOUTH RINSE: 15.0000 mL | Freq: Once | OROMUCOSAL | Status: AC

## 2021-02-22 MED ORDER — ZINC OXIDE 20 % EX OINT
1.0000 "application " | TOPICAL_OINTMENT | Freq: Two times a day (BID) | CUTANEOUS | Status: DC
  Administered 2021-02-23 – 2021-02-28 (×11): 1 via TOPICAL
  Filled 2021-02-22: qty 28.35

## 2021-02-22 MED ORDER — ACETAMINOPHEN 10 MG/ML IV SOLN: 1000.0000 mg | Freq: Once | INTRAVENOUS | Status: DC | PRN

## 2021-02-22 MED ORDER — ROCURONIUM BROMIDE 10 MG/ML (PF) SYRINGE
PREFILLED_SYRINGE | INTRAVENOUS | Status: AC
  Filled 2021-02-22: qty 10

## 2021-02-22 MED ORDER — 0.9 % SODIUM CHLORIDE (POUR BTL) OPTIME
TOPICAL | Status: DC | PRN
  Administered 2021-02-22: 1000 mL

## 2021-02-22 MED ORDER — ALBUMIN HUMAN 5 % IV SOLN: INTRAVENOUS | Status: DC | PRN

## 2021-02-22 MED ORDER — ACETAMINOPHEN 325 MG PO TABS
650.0000 mg | ORAL_TABLET | Freq: Three times a day (TID) | ORAL | Status: DC
  Administered 2021-02-23 – 2021-02-28 (×15): 650 mg via ORAL
  Filled 2021-02-22 (×17): qty 2

## 2021-02-22 MED ORDER — ALBUMIN HUMAN 5 % IV SOLN
INTRAVENOUS | Status: AC
  Filled 2021-02-22: qty 500

## 2021-02-22 MED ORDER — SUGAMMADEX SODIUM 200 MG/2ML IV SOLN
INTRAVENOUS | Status: DC | PRN
  Administered 2021-02-22: 125 mg via INTRAVENOUS

## 2021-02-22 MED ORDER — PHENYLEPHRINE 40 MCG/ML (10ML) SYRINGE FOR IV PUSH (FOR BLOOD PRESSURE SUPPORT)
PREFILLED_SYRINGE | INTRAVENOUS | Status: DC | PRN
  Administered 2021-02-22: 80 ug via INTRAVENOUS
  Administered 2021-02-22 (×2): 120 ug via INTRAVENOUS
  Administered 2021-02-22: 80 ug via INTRAVENOUS

## 2021-02-22 MED ORDER — ONDANSETRON HCL 4 MG/2ML IJ SOLN
INTRAMUSCULAR | Status: DC | PRN
Start: 2021-02-22 — End: 2021-02-22
  Administered 2021-02-22: 4 mg via INTRAVENOUS

## 2021-02-22 MED ORDER — AMISULPRIDE (ANTIEMETIC) 5 MG/2ML IV SOLN
INTRAVENOUS | Status: AC
  Filled 2021-02-22: qty 4

## 2021-02-22 MED ORDER — MORPHINE SULFATE (PF) 2 MG/ML IV SOLN
0.5000 mg | INTRAVENOUS | Status: DC | PRN
  Administered 2021-02-22 – 2021-02-25 (×4): 0.5 mg via INTRAVENOUS
  Filled 2021-02-22 (×4): qty 1

## 2021-02-22 MED ORDER — SODIUM CHLORIDE 0.9 % IV SOLN
0.0000 ug/min | INTRAVENOUS | Status: DC
  Filled 2021-02-22: qty 2

## 2021-02-22 MED ORDER — AMISULPRIDE (ANTIEMETIC) 5 MG/2ML IV SOLN
10.0000 mg | Freq: Once | INTRAVENOUS | Status: AC
  Administered 2021-02-22: 10 mg via INTRAVENOUS

## 2021-02-22 MED ORDER — ROSUVASTATIN CALCIUM 5 MG PO TABS
5.0000 mg | ORAL_TABLET | Freq: Every day | ORAL | Status: DC
  Administered 2021-02-23 – 2021-02-27 (×5): 5 mg via ORAL
  Filled 2021-02-22 (×5): qty 1

## 2021-02-22 MED ORDER — ALBUMIN HUMAN 5 % IV SOLN
12.5000 g | Freq: Once | INTRAVENOUS | Status: AC
  Administered 2021-02-22: 12.5 g via INTRAVENOUS

## 2021-02-22 SURGICAL SUPPLY — 66 items
BAG COUNTER SPONGE SURGICOUNT (BAG) ×2 IMPLANT
BANDAGE ESMARK 6X9 LF (GAUZE/BANDAGES/DRESSINGS) IMPLANT
BIT DRILL CANN LG 4.3MM (BIT) IMPLANT
BIT DRILL WIN 4.5 (BIT) ×1 IMPLANT
BNDG COHESIVE 4X5 TAN STRL (GAUZE/BANDAGES/DRESSINGS) ×2 IMPLANT
BNDG ELASTIC 4X5.8 VLCR STR LF (GAUZE/BANDAGES/DRESSINGS) ×2 IMPLANT
BNDG ELASTIC 6X5.8 VLCR STR LF (GAUZE/BANDAGES/DRESSINGS) ×2 IMPLANT
BNDG ESMARK 6X9 LF (GAUZE/BANDAGES/DRESSINGS)
BNDG GAUZE ELAST 4 BULKY (GAUZE/BANDAGES/DRESSINGS) ×2 IMPLANT
BRUSH SCRUB EZ PLAIN DRY (MISCELLANEOUS) ×4 IMPLANT
COVER MAYO STAND STRL (DRAPES) ×2 IMPLANT
COVER SURGICAL LIGHT HANDLE (MISCELLANEOUS) ×4 IMPLANT
DRAPE C-ARM 42X72 X-RAY (DRAPES) ×2 IMPLANT
DRAPE C-ARMOR (DRAPES) ×2 IMPLANT
DRAPE IMP U-DRAPE 54X76 (DRAPES) ×2 IMPLANT
DRAPE INCISE IOBAN 66X45 STRL (DRAPES) ×2 IMPLANT
DRAPE ORTHO SPLIT 77X108 STRL (DRAPES) ×4
DRAPE SURG ORHT 6 SPLT 77X108 (DRAPES) ×2 IMPLANT
DRAPE U-SHAPE 47X51 STRL (DRAPES) ×2 IMPLANT
DRESSING MEPILEX FLEX 4X4 (GAUZE/BANDAGES/DRESSINGS) IMPLANT
DRILL BIT CANN LG 4.3MM (BIT) ×2
DRSG MEPILEX FLEX 4X4 (GAUZE/BANDAGES/DRESSINGS) ×14
ELECT REM PT RETURN 9FT ADLT (ELECTROSURGICAL) ×2
ELECTRODE REM PT RTRN 9FT ADLT (ELECTROSURGICAL) ×1 IMPLANT
EVACUATOR 1/8 PVC DRAIN (DRAIN) IMPLANT
GAUZE SPONGE 4X4 12PLY STRL (GAUZE/BANDAGES/DRESSINGS) ×2 IMPLANT
GAUZE XEROFORM 1X8 LF (GAUZE/BANDAGES/DRESSINGS) ×2 IMPLANT
GLOVE SRG 8 PF TXTR STRL LF DI (GLOVE) ×1 IMPLANT
GLOVE SURG ENC MOIS LTX SZ8 (GLOVE) ×2 IMPLANT
GLOVE SURG ORTHO LTX SZ7.5 (GLOVE) ×4 IMPLANT
GLOVE SURG UNDER POLY LF SZ7.5 (GLOVE) ×2 IMPLANT
GLOVE SURG UNDER POLY LF SZ8 (GLOVE) ×2
GOWN STRL REUS W/ TWL LRG LVL3 (GOWN DISPOSABLE) ×2 IMPLANT
GOWN STRL REUS W/ TWL XL LVL3 (GOWN DISPOSABLE) ×1 IMPLANT
GOWN STRL REUS W/TWL LRG LVL3 (GOWN DISPOSABLE) ×4
GOWN STRL REUS W/TWL XL LVL3 (GOWN DISPOSABLE) ×2
GUIDEPIN VERSANAIL DSP 3.2X444 (ORTHOPEDIC DISPOSABLE SUPPLIES) ×1 IMPLANT
GUIDEWIRE BEAD TIP (WIRE) ×1 IMPLANT
KIT BASIN OR (CUSTOM PROCEDURE TRAY) ×2 IMPLANT
KIT TURNOVER KIT B (KITS) ×2 IMPLANT
NAIL FEM RETRO 10.5X360 (Nail) ×2 IMPLANT
PACK ORTHO EXTREMITY (CUSTOM PROCEDURE TRAY) ×2 IMPLANT
PACK UNIVERSAL I (CUSTOM PROCEDURE TRAY) ×2 IMPLANT
PAD ARMBOARD 7.5X6 YLW CONV (MISCELLANEOUS) ×4 IMPLANT
SCREW CORT TI DBL LEAD 5X34 (Screw) ×1 IMPLANT
SCREW CORT TI DBL LEAD 5X36 (Screw) ×2 IMPLANT
SCREW CORT TI DBL LEAD 5X38 (Screw) ×1 IMPLANT
SCREW CORT TI DBL LEAD 5X46 (Screw) ×2 IMPLANT
SCREW CORT TI DBL LEAD 5X48 (Screw) ×1 IMPLANT
SCREW CORT TI DBL LEAD 5X60 (Screw) ×1 IMPLANT
SCREW CORT TI DBL LEAD 5X70 (Screw) ×1 IMPLANT
SCREW CORT TI DBL LEAD 5X75 (Screw) ×2 IMPLANT
SPONGE T-LAP 18X18 ~~LOC~~+RFID (SPONGE) ×2 IMPLANT
STAPLER VISISTAT 35W (STAPLE) ×2 IMPLANT
STOCKINETTE IMPERVIOUS LG (DRAPES) ×2 IMPLANT
SUT ETHILON 2 0 FS 18 (SUTURE) ×1 IMPLANT
SUT PROLENE 3 0 PS 2 (SUTURE) IMPLANT
SUT VIC AB 0 CT1 27 (SUTURE)
SUT VIC AB 0 CT1 27XBRD ANBCTR (SUTURE) IMPLANT
SUT VIC AB 2-0 CT1 27 (SUTURE)
SUT VIC AB 2-0 CT1 TAPERPNT 27 (SUTURE) IMPLANT
SUT VIC AB 2-0 CT3 27 (SUTURE) IMPLANT
TOWEL GREEN STERILE (TOWEL DISPOSABLE) ×4 IMPLANT
TOWEL GREEN STERILE FF (TOWEL DISPOSABLE) ×2 IMPLANT
TUBE CONNECTING 12X1/4 (SUCTIONS) ×2 IMPLANT
YANKAUER SUCT BULB TIP NO VENT (SUCTIONS) ×2 IMPLANT
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
## 2021-02-22 NOTE — Anesthesia Procedure Notes (Signed)
Procedure Name: Intubation Date/Time: 02/22/2021 3:01 PM Performed by: Reece Agar, CRNA Pre-anesthesia Checklist: Patient identified, Emergency Drugs available, Suction available and Patient being monitored Patient Re-evaluated:Patient Re-evaluated prior to induction Oxygen Delivery Method: Circle System Utilized Preoxygenation: Pre-oxygenation with 100% oxygen Induction Type: IV induction Ventilation: Mask ventilation without difficulty Laryngoscope Size: Mac and 3 Grade View: Grade I Tube type: Oral Tube size: 7.0 mm Number of attempts: 1 Airway Equipment and Method: Stylet Placement Confirmation: ETT inserted through vocal cords under direct vision, positive ETCO2 and breath sounds checked- equal and bilateral Secured at: 21 cm Tube secured with: Tape Dental Injury: Teeth and Oropharynx as per pre-operative assessment

## 2021-02-22 NOTE — Progress Notes (Signed)
Orthopedic Tech Progress Note Patient Details:  FRANCOISE CHOJNOWSKI 03-Dec-1928 [id number]  Patient ID: Tracey Harries, female   DOB: 1928/03/16, 88 y.o.   MRN: [id number] No OHF; over age limit. Darleen Crocker 02/22/2021, 7:48 PM

## 2021-02-22 NOTE — Op Note (Addendum)
02/22/2021  5:40 PM  PATIENT:  Emily Camacho  Jul 22, 1928 female   MEDICAL RECORD NUMBER: [id number]  PREOPERATIVE DIAGNOSIS:  LEFT FEMORAL SHAFT FRACTURE WITH SUPRACONDYLAR EXTENSION. RIGHT FEMORAL SHAFT FRACTURE. 3. SEVERE OSTEOPOROSIS.  POSTOPERATIVE DIAGNOSIS:   LEFT FEMORAL SHAFT FRACTURE. RIGHT FEMORAL SHAFT FRACTURE. SEVERE OSTEOPOROSIS.  PROCEDURES: 1.  RETROGRADE INTRAMEDULLARY NAILING OF THE LEFT FEMUR with 10.5 X 340  mm statically locked nail. 2.  RETROGRADE INTRAMEDULLARY NAILING OF THE RIGHT FEMUR with 10.5 X 340  mm statically locked nail.  SURGEON:  Doralee Albino. Carola Frost, [name]  ASSISTANT:  Montez Morita, PA-C.  ANESTHESIA:  General.  COMPLICATIONS:  None.  TOURNIQUET: None.  ESTIMATED BLOOD LOSS:  <100 mL.  DISPOSITION:  To PACU.  CONDITION:  Stable.  DELAY START OF DVT PROPHYLAXIS BECAUSE OF BLEEDING RISK: NO  BRIEF SUMMARY OF INDICATION FOR PROCEDURE:  Emily Camacho is a 88 y.o. who sustained bilateral femur fractures secondary to severe osteoporosis in an unwitnessed fall. Because of her delicate skin and bilateral injuries I recommended retrograde nailing of both femurs as a palliative measure to facilitate early mobilization and range of motion. The risks and benefits of this operation were discussed with the patient including the possibilities of infection, nerve injury, vessel injury, DVT/ PE, loss of motion, arthritis, symptomatic hardware, and need for further surgery among others.  After full discussion, the patient's son gave consent to proceed.  BRIEF SUMMARY OF PROCEDURE:  After administration of preoperative antibiotics, the patient was taken to the operating room where general anesthesia was induced.  Careful positioning was performed with bumps placed under both hips, and control of both fractures with traction during positioning and prepping to prevent neurovascular injury. Standard prep and drape was performed with chlorhexidine scrub and wash  initially, then Betadine scrub and paint.  Time-out was held and then the radiolucent triangle and towel bumps used to gain length and sagittal alignment with traction and flexion of the knee, beginning on the left knee.  A 2.5 cm incision was made at the base of the patellar tendon, extending proximally, followed with a medial parapatellar retinacular incision.  The sharp guide pin was placed directly anterior to Blumensaat's line in the middle of the condyle and advanced on AP and lateral projections into the center-center position of the distal femur. My assistant continued traction while I fine tuned the bumps and he also applied pressure with a mallet to produce coronal plane angulation.  The starting reamer was then used distally while protecting the soft tissues.  This was followed by introduction of the ball-tipped guidewire across the fracture site into the proximal femur up close to the piriformis.  Nail length was measured. Reaming was performed with 12 mm and the nail placed and checked for position. After confirming appropriate seating of the nail beyond Blumensaat's line on the lateral, locking screws were placed distally off the guide, and checked on orthogonal images to confirm placement within the nail and appropriate length. Two proximal locks were then placed in static mode using perfect circle technique and a captured screwdriver.  These screws were also confirmed for length and position. An assistant was required to obtain and maintain reduction during instrumentation.  We then moved the radiolucent triangle and towel bumps to the right leg where flexion contracture was limiting but allowed just enough flexion that a nail could be placed. Using bumps to gain length and sagittal alignment with traction and flexion of the knee.  A 2.5 cm incision was  made at the base of the patellar tendon, extending proximally, followed with a medial parapatellar retinacular incision.  The sharp guide pin was  placed directly anterior to Blumensaat's line in the middle of the condyle and advanced on AP and lateral projections into the center-center position of the distal femur. The starting reamer was then used distally while protecting the soft tissues.  This was followed by introduction of the ball-tipped guidewire across the fracture site into the proximal femur up close to the piriformis. Reaming was performed with 12 mm and the same length nail placed and checked for position. After confirming appropriate seating of the nail beyond Blumensaat's line on the lateral, locking screws were placed distally off the guide, and checked on orthogonal images to confirm placement within the nail and appropriate length. Two proximal locks were then placed in static mode using perfect circle technique and a captured screwdriver.  These screws were also confirmed for length and position. An assistant was required to obtain and maintain reduction during instrumentation.  We then irrigated all wounds thoroughly and closed them in standard layered fashion, working simultaneously to reduce overall time in the OR. The wounds were dressed and a gently compressive wrap from the ankle to thigh was applied.  The patient was awakened and taken to the PACU in stable condition.    PROGNOSIS:  The patient will have unrestricted range of motion of the knee and hip to the extent tolerated, and early mobilization will be encouraged with bed to chair. Profound osteoporosis as anticipated. Patient will have mechanical DVT prophylaxis and is not a candidate for long term pharmaco-prophylaxis because of her dementia. Metabolic bone workup is ongoing. Given the patients associated injuries and comorbidities the risk of complications remains elevated which has been discussed with the family. She needs very gentle handling.   Doralee Albino. Carola Frost, [name]

## 2021-02-22 NOTE — ED Notes (Signed)
Admitting at bedside 

## 2021-02-22 NOTE — Anesthesia Preprocedure Evaluation (Addendum)
Anesthesia Evaluation  Patient identified by MRN, date of birth, ID band Patient confused    Reviewed: Allergy & Precautions, NPO status , Patient's Chart, lab work & pertinent test results  Airway Mallampati: II  TM Distance: >3 FB Neck ROM: Full    Dental  (+) Poor Dentition, Missing   Pulmonary neg pulmonary ROS,    Pulmonary exam normal        Cardiovascular hypertension, Pt. on medications and Pt. on home beta blockers +CHF   Rhythm:Regular Rate:Normal     Neuro/Psych Dementia negative neurological ROS     GI/Hepatic Neg liver ROS, GERD  ,  Endo/Other  negative endocrine ROS  Renal/GU Renal disease  negative genitourinary   Musculoskeletal  (+) Arthritis , Osteoarthritis,  Femur fx   Abdominal Normal abdominal exam  (+)   Peds  Hematology negative hematology ROS (+)   Anesthesia Other Findings   Reproductive/Obstetrics                           Anesthesia Physical Anesthesia Plan  ASA: 3  Anesthesia Plan: General   Post-op Pain Management:    Induction: Intravenous  PONV Risk Score and Plan: 3 and Ondansetron, Dexamethasone and Treatment may vary due to age or medical condition  Airway Management Planned: Mask and Oral ETT  Additional Equipment: None  Intra-op Plan:   Post-operative Plan: Extubation in OR  Informed Consent: I have reviewed the patients History and Physical, chart, labs and discussed the procedure including the risks, benefits and alternatives for the proposed anesthesia with the patient or authorized representative who has indicated his/her understanding and acceptance.   Patient has DNR.  Discussed DNR with power of attorney and Suspend DNR.   Dental advisory given  Plan Discussed with: CRNA  Anesthesia Plan Comments: (Lab Results      Component                Value               Date                      WBC                      10.1                 02/22/2021                HGB                      11.0 (L)            02/22/2021                HCT                      33.7 (L)            02/22/2021                MCV                      92.6                02/22/2021                PLT  313                 02/22/2021           Lab Results      Component                Value               Date                      NA                       137                 02/22/2021                K                        3.1 (L)             02/22/2021                CO2                      22                  02/22/2021                GLUCOSE                  202 (H)             02/22/2021                BUN                      22                  02/22/2021                CREATININE               0.92                02/22/2021                CALCIUM                  8.8 (L)             02/22/2021                GFRNONAA                 59 (L)              02/22/2021          )       Anesthesia Quick Evaluation

## 2021-02-22 NOTE — Transfer of Care (Signed)
Immediate Anesthesia Transfer of Care Note  Patient: Emily Camacho  Procedure(s) Performed: INTRAMEDULLARY (IM) RETROGRADE FEMORAL NAILING (Bilateral)  Patient Location: PACU  Anesthesia Type:General  Level of Consciousness: awake and alert   Airway & Oxygen Therapy: Patient Spontanous Breathing and Patient connected to face mask oxygen  Post-op Assessment: Report given to RN and Post -op Vital signs reviewed and stable  Post vital signs: Reviewed  Pt hypotensive in PACU. Dr. Krista Blue aware, restarted Phenylephrine gtt and giving another albumin.   Last Vitals:  Vitals Value Taken Time  BP 81/46 02/22/21 1802  Temp    Pulse 87 02/22/21 1800  Resp 17 02/22/21 1803  SpO2 98 % 02/22/21 1800  Vitals shown include unvalidated device data.  Last Pain:  Vitals:   02/22/21 1348  TempSrc: Oral  PainSc:          Complications: No notable events documented.

## 2021-02-22 NOTE — H&P (Signed)
History and Physical    Emily Camacho [id number] DOB: 1928/09/24 DOA: 02/22/2021  Referring MD/NP/PA: Gareth Morgan, MD PCP: [name], DO  [facility]  Chief Complaint: Fall  I have personally briefly reviewed patient's old medical records in [place]   HPI: Emily Camacho is a 88 y.o. female with medical history significant of hypertension, CHF, hypothyroidism, dementia, and GERD presents after having an unwitnessed fall at the nursing facility where she resides.  At baseline patient has pretty significant dementia and will mix up dates and facts as noted per her son who is sitting at bedside.  Normally she can recognize family members, but reports that she has been bedbound for 2 years.  She was noted to complain of pain in her legs and it was reported that she had a deformity of the right ankle with pulse present. In route with EMS patient had been given fentanyl 100 mcg.  To the son's knowledge patient had last been doing well.  The patient's son concerns that the patient had a living Will where he is the healthcare power of attorney, but also stated her wishes in further detail.  She would not want CPR, intubation, feeding tubes, or any other aggressive treatments.  The patient had signed paperwork stating that she was okay for with pain medications.  Patient's son notes that he is okay with the patient going to surgery because the only other option is for her to be a cast which would make her overall care more difficult in a nursing facility.  ED Course: Upon admission into the emergency department patient was noted to have pulse 59-77, and all other vital signs relatively maintained.  Labs significant for hemoglobin 9.9, potassium 3.2, and albumin 2.7.  Patient was found to have spiral fracture of the distal right femoral shaft with mid displacement angulation and communication fragments.  Subsequent x-rays of the left  knee noted mildly displaced fracture of the distal femoral metaphysis with extension toward the epiphysis.  Dr. Marcelino Scot had been consulted orthopedics and recommended keeping patient n.p.o.    Review of Systems  Unable to perform ROS: Dementia  Musculoskeletal:  Positive for falls and joint pain.  Psychiatric/Behavioral:  Positive for memory loss.    Past Medical History:  Diagnosis Date   Arthritis    Cardiomyopathy    tako tsubo   CHF (congestive heart failure) (HCC)    Dyslipidemia    Epistaxis    GERD (gastroesophageal reflux disease)    HTN (hypertension)    Renal disorder     Past Surgical History:  Procedure Laterality Date   CATARACT EXTRACTION     Bilateral eyes   KNEE SURGERY  90's   TOE SURGERY     TOTAL ABDOMINAL HYSTERECTOMY W/ BILATERAL SALPINGOOPHORECTOMY       reports that she has never smoked. She has never used smokeless tobacco. She reports that she does not drink alcohol and does not use drugs.  Allergies  Allergen Reactions   Penicillins     Did it involve swelling of the face/tongue/throat, SOB, or low BP? no Did it involve sudden or severe rash/hives, skin peeling, or any reaction on the inside of your mouth or nose? Yes  Did you need to seek medical attention at a hospital or doctor's office? N/A When did it last happen? unknown  If all above answers are "NO", may proceed with cephalosporin use.   Sulfonamide Derivatives  Latex Rash    Family History  Problem Relation Age of Onset   Cancer Mother        colon    Prior to Admission medications   Medication Sig Start Date End Date Taking? Authorizing Provider  aspirin 325 MG tablet Take 325 mg by mouth daily.   Yes [provider]  levocetirizine (XYZAL) 5 MG tablet Take 5 mg by mouth daily. 01/30/20  Yes [provider]  levothyroxine (SYNTHROID) 25 MCG tablet Take 25 mcg by mouth daily. 01/15/20  Yes [provider]  lisinopril (ZESTRIL) 20 MG tablet Take 1 tablet  (20 mg total) by mouth daily. 01/08/19  Yes Antonieta Pert, MD  metoprolol succinate (TOPROL-XL) 25 MG 24 hr tablet Take 25-50 mg by mouth daily. 10/05/10  Yes Lelon Perla, MD  Multiple Vitamins-Minerals (CENTRUM PO) Take 1 tablet by mouth daily at 12 noon. 1 tab po qd   Yes [provider]  oxybutynin (DITROPAN-XL) 5 MG 24 hr tablet Take 5 mg by mouth daily. 01/22/20  Yes [provider]  rosuvastatin (CRESTOR) 5 MG tablet Take 5 mg by mouth at bedtime. 02/08/20  Yes [provider]  zinc oxide 20 % ointment Apply 1 application topically See admin instructions. Apply to sacrum every shift for wound   Yes [provider]  acetaminophen (TYLENOL) 650 MG CR tablet Take 650 mg by mouth every 4 (four) hours as needed (general discomfort).    [provider]  azithromycin (ZITHROMAX) 250 MG tablet Take 1 tablet (250 mg total) by mouth daily. Take first 2 tablets together, then 1 every day until finished. 02/29/20   Montine Circle, PA-C  Cholecalciferol (VITAMIN D) 400 UNITS capsule Take 400 Units by mouth 2 (two) times daily.    [provider]  Ensure Plus (ENSURE PLUS) LIQD Take 237 mLs by mouth 3 (three) times daily.    [provider]  feeding supplement, ENSURE ENLIVE, (ENSURE ENLIVE) LIQD Take 237 mLs by mouth 2 (two) times daily between meals. Patient not taking: No sig reported 01/08/19   Modena Jansky, MD  ipratropium-albuterol (DUONEB) 0.5-2.5 (3) MG/3ML SOLN Take 3 mLs by nebulization every 8 (eight) hours as needed (shortness of breath/wheezing). 11/22/19   [provider]  ondansetron (ZOFRAN ODT) 4 MG disintegrating tablet Take 1 tablet (4 mg total) by mouth every 8 (eight) hours as needed for nausea or vomiting. 02/29/20   Montine Circle, PA-C  OVER THE COUNTER MEDICATION Take 120 mLs by mouth 3 (three) times daily. Med pass. --- for wound/weight    [provider]  traMADol (ULTRAM) 50 MG tablet Take 50 mg  by mouth as needed for moderate pain or severe pain.    [provider]    Physical Exam:  Constitutional: Elderly female currently in no acute distress and able to follow commands Vitals:   02/22/21 0745 02/22/21 0800 02/22/21 0815 02/22/21 0830  BP: 131/66 104/81 111/69 121/65  Pulse: 65 77 67 68  Resp: 17 19 (!) 33 19  Temp:      TempSrc:      SpO2: 100% 98% 96% 98%   Eyes: PERRL, lids and conjunctivae normal ENMT: Mucous membranes are moist. Posterior pharynx clear of any exudate or lesions.  Neck: normal, supple, no masses, no thyromegaly Respiratory: clear to auscultation bilaterally, no wheezing, no crackles. Normal respiratory effort.  Currently on 2 L nasal cannula oxygen with O2 saturations 90 Cardiovascular: Regular rate and rhythm, no murmurs /  rubs / gallops. No extremity edema. 1+ pedal pulses.  No significant lower extremity edema Abdomen: no tenderness, no masses palpated. Bowel sounds positive.  Musculoskeletal: no clubbing / cyanosis.  Deformity noted of the right ankle which is externally rotated.  Tenderness palpation of the right thigh and of the distal left thigh..  Skin: no rashes, lesions, ulcers. No induration Neurologic: CN 2-12 grossly intact.  Sensation appears to be intact. Psychiatric: Poor memory.  Alert and oriented to self.    Labs on Admission: I have personally reviewed following labs and imaging studies  CBC: Recent Labs  Lab 02/22/21 0613  WBC 10.1  NEUTROABS 7.8*  HGB 11.0*  HCT 33.7*  MCV 92.6  PLT [id number]   Basic Metabolic Panel: Recent Labs  Lab 02/22/21 0613  NA 137  K 3.1*  CL 103  CO2 22  GLUCOSE 202*  BUN 22  CREATININE 0.92  CALCIUM 8.8*   GFR: CrCl cannot be calculated (Unknown ideal weight.). Liver Function Tests: No results for input(s): AST, ALT, ALKPHOS, BILITOT, PROT, ALBUMIN in the last 168 hours. No results for input(s): LIPASE, AMYLASE in the last 168 hours. No results for input(s): AMMONIA in the  last 168 hours. Coagulation Profile: No results for input(s): INR, PROTIME in the last 168 hours. Cardiac Enzymes: No results for input(s): CKTOTAL, CKMB, CKMBINDEX, TROPONINI in the last 168 hours. BNP (last 3 results) No results for input(s): PROBNP in the last 8760 hours. HbA1C: No results for input(s): HGBA1C in the last 72 hours. CBG: No results for input(s): GLUCAP in the last 168 hours. Lipid Profile: No results for input(s): CHOL, HDL, LDLCALC, TRIG, CHOLHDL, LDLDIRECT in the last 72 hours. Thyroid Function Tests: No results for input(s): TSH, T4TOTAL, FREET4, T3FREE, THYROIDAB in the last 72 hours. Anemia Panel: No results for input(s): VITAMINB12, FOLATE, FERRITIN, TIBC, IRON, RETICCTPCT in the last 72 hours. Urine analysis:    Component Value Date/Time   COLORURINE AMBER (A) 02/28/2020 2252   APPEARANCEUR CLOUDY (A) 02/28/2020 2252   LABSPEC 1.019 02/28/2020 2252   PHURINE 6.0 02/28/2020 2252   GLUCOSEU NEGATIVE 02/28/2020 2252   HGBUR NEGATIVE 02/28/2020 2252   BILIRUBINUR NEGATIVE 02/28/2020 2252   KETONESUR NEGATIVE 02/28/2020 2252   PROTEINUR 30 (A) 02/28/2020 2252   UROBILINOGEN 0.2 07/13/2009 1622   NITRITE NEGATIVE 02/28/2020 2252   LEUKOCYTESUR NEGATIVE 02/28/2020 2252   Sepsis Labs: Recent Results (from the past 240 hour(s))  Resp Panel by RT-PCR (Flu A&B, Covid) Nasopharyngeal Swab     Status: None   Collection Time: 02/22/21  6:14 AM   Specimen: Nasopharyngeal Swab; Nasopharyngeal(NP) swabs in vial transport medium  Result Value Ref Range Status   SARS Coronavirus 2 by RT PCR NEGATIVE NEGATIVE Final    Comment: (NOTE) SARS-CoV-2 target nucleic acids are NOT DETECTED.  The SARS-CoV-2 RNA is generally detectable in upper respiratory specimens during the acute phase of infection. The lowest concentration of SARS-CoV-2 viral copies this assay can detect is 138 copies/mL. A negative result does not preclude SARS-Cov-2 infection and should not be used as  the sole basis for treatment or other patient management decisions. A negative result may occur with  improper specimen collection/handling, submission of specimen other than nasopharyngeal swab, presence of viral mutation(s) within the areas targeted by this assay, and inadequate number of viral copies(<138 copies/mL). A negative result must be combined with clinical observations, patient history, and epidemiological information. The expected result is Negative.  Fact Sheet for Patients:  EntrepreneurPulse.com.au  Fact  Sheet for Healthcare Providers:  IncredibleEmployment.be  This test is no t yet approved or cleared by the [place] FDA and  has been authorized for detection and/or diagnosis of SARS-CoV-2 by FDA under an Emergency Use Authorization (EUA). This EUA will remain  in effect (meaning this test can be used) for the duration of the COVID-19 declaration under Section 564(b)(1) of the Act, 21 U.S.C.section 360bbb-3(b)(1), unless the authorization is terminated  or revoked sooner.       Influenza A by PCR NEGATIVE NEGATIVE Final   Influenza B by PCR NEGATIVE NEGATIVE Final    Comment: (NOTE) The Xpert Xpress SARS-CoV-2/FLU/RSV plus assay is intended as an aid in the diagnosis of influenza from Nasopharyngeal swab specimens and should not be used as a sole basis for treatment. Nasal washings and aspirates are unacceptable for Xpert Xpress SARS-CoV-2/FLU/RSV testing.  Fact Sheet for Patients: EntrepreneurPulse.com.au  Fact Sheet for Healthcare Providers: IncredibleEmployment.be  This test is not yet approved or cleared by the [place] FDA and has been authorized for detection and/or diagnosis of SARS-CoV-2 by FDA under an Emergency Use Authorization (EUA). This EUA will remain in effect (meaning this test can be used) for the duration of the COVID-19 declaration under Section 564(b)(1) of  the Act, 21 U.S.C. section 360bbb-3(b)(1), unless the authorization is terminated or revoked.  Performed at [facility], [place] [address], [place], [place] [address]      Radiological Exams on Admission: DG Knee 1-2 Views Left  Result Date: 02/22/2021 CLINICAL DATA:  Fall EXAM: LEFT KNEE - 1-2 VIEW COMPARISON:  None. FINDINGS: There is a mildly displaced oblique fracture of the distal femoral metaphysis which extends towards the epiphysis without definite evidence of intra-articular extension. Knee alignment appears maintained. IMPRESSION: Mildly displaced fracture of the distal femoral metaphysis with extension towards the epiphysis without definite evidence of intra-articular extension. Electronically Signed   By: Valetta Mole [name]   On: 02/22/2021 08:12   DG Ankle Complete Right  Result Date: 02/22/2021 CLINICAL DATA:  88 year old female found down after fall. Right lower extremity deformity. EXAM: RIGHT ANKLE - COMPLETE 3+ VIEW COMPARISON:  Right ankle series 12/31/2018. FINDINGS: Mortise joint alignment appears stable since 2020, with osteopenia and no definite acute fracture of the distal tibia or fibula. Talus and calcaneus appear stable, with chronic pes planus and a degree of chronic midfoot collapse. Right foot series is reported separately today. Calcified peripheral vascular disease. IMPRESSION: Osteopenia with chronic pes planus and mid/hind-foot collapse. No definite acute fracture about the right ankle. Electronically Signed   By: Genevie Ann [name]   On: 02/22/2021 07:15   CT Head Wo Contrast  Result Date: 02/22/2021 CLINICAL DATA:  Head trauma, minor EXAM: CT HEAD WITHOUT CONTRAST CT CERVICAL SPINE WITHOUT CONTRAST TECHNIQUE: Multidetector CT imaging of the head and cervical spine was performed following the standard protocol without intravenous contrast. Multiplanar CT image reconstructions of the cervical spine were also generated. RADIATION DOSE REDUCTION: This exam was  performed according to the departmental dose-optimization program which includes automated exposure control, adjustment of the mA and/or kV according to patient size and/or use of iterative reconstruction technique. COMPARISON:  CT head dated December 31, 2018 FINDINGS: CT HEAD FINDINGS Brain: No evidence of acute infarction, hemorrhage, hydrocephalus, extra-axial collection or mass lesion/mass effect. Moderate cerebral atrophy and chronic microvascular ischemic changes of the white matter, unchanged Vascular: No hyperdense vessel or unexpected calcification. Skull: Normal. Negative for fracture or focal lesion. Sinuses/Orbits: Bilateral cataract surgery.  Acute abnormality. Other: None. CT CERVICAL SPINE FINDINGS Alignment: Exaggeration of normal cervical lordosis. Mild anterolisthesis of C4. Skull base and vertebrae: No acute fracture. No primary bone lesion or focal pathologic process. Soft tissues and spinal canal: No prevertebral fluid or swelling. No visible canal hematoma. Disc levels: Advanced degenerate disc disease with osseous fusion at C5-C6 and near complete loss of disc space with prominent osteophytes at C6-C7. C2-C3:  No significant finding. C3-C4:  No significant spinal canal or neural foraminal narrowing. C4-C5:  No significant spinal canal or neural foraminal narrowing. C5-C6: Osseous fusion with mild spinal canal and bilateral neural foraminal narrowing, left worse than the right. C6-C7: Disc osteophyte complex with mild spinal canal and bilateral neural foraminal narrowing. Moderate uncovertebral joint and facet joint arthropathy. C7-T1:  Significant spinal canal or neural foraminal narrowing. Upper chest: No acute abnormality. Other: None IMPRESSION: 1. No acute intracranial abnormality. Cerebral atrophy and chronic microvascular ischemic changes. 2.  No acute fracture. 3. Advanced multilevel degenerative disc disease most prominent at C5-C6 and C6-C7. Electronically Signed   By: Keane Police  D.O.   On: 02/22/2021 09:10   CT Cervical Spine Wo Contrast  Result Date: 02/22/2021 CLINICAL DATA:  Head trauma, minor EXAM: CT HEAD WITHOUT CONTRAST CT CERVICAL SPINE WITHOUT CONTRAST TECHNIQUE: Multidetector CT imaging of the head and cervical spine was performed following the standard protocol without intravenous contrast. Multiplanar CT image reconstructions of the cervical spine were also generated. RADIATION DOSE REDUCTION: This exam was performed according to the departmental dose-optimization program which includes automated exposure control, adjustment of the mA and/or kV according to patient size and/or use of iterative reconstruction technique. COMPARISON:  CT head dated December 31, 2018 FINDINGS: CT HEAD FINDINGS Brain: No evidence of acute infarction, hemorrhage, hydrocephalus, extra-axial collection or mass lesion/mass effect. Moderate cerebral atrophy and chronic microvascular ischemic changes of the white matter, unchanged Vascular: No hyperdense vessel or unexpected calcification. Skull: Normal. Negative for fracture or focal lesion. Sinuses/Orbits: Bilateral cataract surgery.  Acute abnormality. Other: None. CT CERVICAL SPINE FINDINGS Alignment: Exaggeration of normal cervical lordosis. Mild anterolisthesis of C4. Skull base and vertebrae: No acute fracture. No primary bone lesion or focal pathologic process. Soft tissues and spinal canal: No prevertebral fluid or swelling. No visible canal hematoma. Disc levels: Advanced degenerate disc disease with osseous fusion at C5-C6 and near complete loss of disc space with prominent osteophytes at C6-C7. C2-C3:  No significant finding. C3-C4:  No significant spinal canal or neural foraminal narrowing. C4-C5:  No significant spinal canal or neural foraminal narrowing. C5-C6: Osseous fusion with mild spinal canal and bilateral neural foraminal narrowing, left worse than the right. C6-C7: Disc osteophyte complex with mild spinal canal and bilateral  neural foraminal narrowing. Moderate uncovertebral joint and facet joint arthropathy. C7-T1:  Significant spinal canal or neural foraminal narrowing. Upper chest: No acute abnormality. Other: None IMPRESSION: 1. No acute intracranial abnormality. Cerebral atrophy and chronic microvascular ischemic changes. 2.  No acute fracture. 3. Advanced multilevel degenerative disc disease most prominent at C5-C6 and C6-C7. Electronically Signed   By: Keane Police D.O.   On: 02/22/2021 09:10   DG Chest Portable 1 View  Result Date: 02/22/2021 CLINICAL DATA:  88 year old female found down after fall. Right lower extremity deformity. EXAM: PORTABLE CHEST 1 VIEW COMPARISON:  Portable chest 12/27/2020 and earlier. FINDINGS: Portable AP supine view at 0638 hours. Chronic large hiatal hernia with increased gastric air today. Stable cardiac size and mediastinal contours. Calcified aortic atherosclerosis. No pneumothorax  identified on this supine view. No pulmonary edema. No definite effusion. Chronic atelectasis associated with the hernia. Chronic appearing right lateral 5th rib fracture. No acute osseous abnormality identified. Negative visible bowel gas. IMPRESSION: 1. No acute cardiopulmonary abnormality. 2. Chronic large hiatal hernia. Electronically Signed   By: Genevie Ann [name]   On: 02/22/2021 07:13   DG Knee Right Port  Result Date: 02/22/2021 CLINICAL DATA:  88 year old female found down after fall. Right ankle deformity. EXAM: PORTABLE RIGHT KNEE - 1-2 VIEW COMPARISON:  Right hip series today. Right knee series 12/31/2018. FINDINGS: Spiral fracture distal right femoral shaft. Overriding about 2.5 cm. 1/2 shaft width lateral displacement. Similar mild anterior displacement. Mild anterior and medial angulation. Comminution fragments better demonstrated on the right hip series today. Joint spaces and alignment at the right knee appears stable with chronic tricompartmental degeneration. Proximal right tibia and fibula appear  stable and intact. Calcified peripheral vascular disease. IMPRESSION: 1. Spiral fracture of the distal right femoral shaft with mild displacement and angulation. Comminution fragments better demonstrated on right hip series today. 2. Right knee remains intact, with chronic tricompartmental degeneration. 3. Calcified peripheral vascular disease. Electronically Signed   By: Genevie Ann [name]   On: 02/22/2021 07:11   DG Foot 2 Views Right  Result Date: 02/22/2021 CLINICAL DATA:  88 year old female found down after fall. Right lower extremity deformity. EXAM: RIGHT FOOT - 2 VIEW COMPARISON:  Right foot series 07/13/2009. FINDINGS: Diffuse osteopenia. Chronic pes planus and mid to hindfoot collapse which has not significantly changed from 20/11. No superimposed acute fracture or dislocation identified in the right foot. Calcified peripheral vascular disease. IMPRESSION: 1. No acute fracture or dislocation identified about the right foot. 2. Chronic osteopenia, pes planus and mid or hindfoot collapse. Electronically Signed   By: Genevie Ann [name]   On: 02/22/2021 07:16   DG Hip Unilat W or Wo Pelvis 2-3 Views Left  Result Date: 02/22/2021 CLINICAL DATA:  Fall EXAM: DG HIP (WITH OR WITHOUT PELVIS) 2-3V LEFT COMPARISON:  CT 01/06/2019. FINDINGS: Diffuse osteopenia. There is severe bilateral hip osteoarthritis with remodeling of the femoral heads and acetabuli bilaterally. There is no definite acute fracture, evaluation limited due to remodeled bone. IMPRESSION: Severe bilateral hip osteoarthritis with bony remodeling. No definite acute fracture. Note that if the patient is unable to bear weight and clinical suspicion for hip fracture is significant, CT or MRI would be more sensitive. Electronically Signed   By: Maurine Simmering [name]   On: 02/22/2021 08:13   DG FEMUR, MIN 2 VIEWS RIGHT  Result Date: 02/22/2021 CLINICAL DATA:  88 year old female found down after fall. Right ankle deformity. EXAM: RIGHT FEMUR 2 VIEWS COMPARISON:   Bilateral hip series 12/31/2018. FINDINGS: Comminuted spiral fracture of the distal right femur shaft is partially visible. Comminution fragments up to 2 cm individually. Lateral angulation. Advanced chronic degeneration at the right hip which appears stable since 2020. Grossly intact visible right hemipelvis. Calcified femoral artery atherosclerosis. IMPRESSION: 1. Comminuted spiral fracture of the distal right femur shaft is partially visible with lateral angulation and comminution. 2. Right hip appears stable since 2020. Electronically Signed   By: Genevie Ann [name]   On: 02/22/2021 07:09    EKG: Independently reviewed.  Sinus rhythm at 64 bpm with first-degree heart  Assessment/Plan Bilateral femur fracture secondary to fall: Acute.  Patient presents after after having unwitnessed fall.  Imaging revealed bilateral femur fractures.  Orthopedics was formally consulted with plans to take the patient to  the operating room today. -Admit to telemetry bed -Hip fracture order set utilized -N.p.o.  -Hydrocodone/morphine IV as needed for pain -Transitions of care consulted -Orthopedics consulted, will follow-up for any further recommendations  Hypokalemia: Acute.  Potassium noted to be 3.2 on admission. -Give potassium chloride 40 mill equivalents p.o.   Dementia: Patient has pretty significant dementia and is usually oriented to self and is still able to recognize family members. -Delirium precautions -Bed alarm on  Hyperglycemia: Acute.  Glucose noted to be elevated up to 202 today.  She is not normally on any diabetic medications. -Continue to monitor and would place on sliding scale of insulin if blood sugars remain greater than 180  Bedbound: At baseline patient has been bedbound for the last 2 years has noted for the patient's son.     Essential hypertension: Blood pressures were currently noted to be soft and 103/57.  Home blood pressure regimen includes metoprolol and lisinopril. -Held home  blood pressure medications at this time.  Reassess in a.m. and determine when medically appropriate to resume -Hydralazine IV as needed for elevated blood pressure greater than 180  Hypothyroidism -Continue levothyroxine  Hyperlipidemia -Continue statin  Normocytic anemia: Hemoglobin 11 g/dL which appears improved from previous baseline. -Continue to monitor  DVT prophylaxis: Lovenox Code Status: DNR Family Communication: Patient's son updated at bedside Disposition Plan: Likely will need discharge back to nursing facility Consults called: Orthopedics Admission status: Inpatient, require more than 2 midnight stay for need of procedure  Norval Morton MD [facility]   If 7PM-7AM, please contact night-coverage   02/22/2021, 10:13 AM

## 2021-02-22 NOTE — Consult Note (Signed)
Reason for Consult:Femur fxs Referring Physician: Alvira Monday Time called: [id number] Time at bedside: 0903   Emily Camacho is an 88 y.o. female.  HPI: Marlon suffered an unwitnessed fall. She was brought to the ED c/o bilateral leg pain. X-rays showed bilateral femur fxs and orthopedic surgery was consulted. She lives in a SNF and does not ambulate. She is quite demented and cannot contribute to history.  Past Medical History:  Diagnosis Date   Arthritis    Cardiomyopathy    tako tsubo   CHF (congestive heart failure) (HCC)    Dyslipidemia    Epistaxis    GERD (gastroesophageal reflux disease)    HTN (hypertension)    Renal disorder     Past Surgical History:  Procedure Laterality Date   CATARACT EXTRACTION     Bilateral eyes   KNEE SURGERY  90's   TOE SURGERY     TOTAL ABDOMINAL HYSTERECTOMY W/ BILATERAL SALPINGOOPHORECTOMY      Family History  Problem Relation Age of Onset   Cancer Mother        colon    Social History:  reports that she has never smoked. She has never used smokeless tobacco. She reports that she does not drink alcohol and does not use drugs.  Allergies:  Allergies  Allergen Reactions   Penicillins     Did it involve swelling of the face/tongue/throat, SOB, or low BP? no Did it involve sudden or severe rash/hives, skin peeling, or any reaction on the inside of your mouth or nose? Yes  Did you need to seek medical attention at a hospital or doctor's office? N/A When did it last happen? unknown  If all above answers are "NO", may proceed with cephalosporin use.   Sulfonamide Derivatives    Latex Rash    Medications: I have reviewed the patient's current medications.  Results for orders placed or performed during the hospital encounter of 02/22/21 (from the past 48 hour(s))  CBC with Differential     Status: Abnormal   Collection Time: 02/22/21  6:13 AM  Result Value Ref Range   WBC 10.1 4.0 - 10.5 K/uL   RBC 3.64 (L) 3.87 - 5.11 MIL/uL    Hemoglobin 11.0 (L) 12.0 - 15.0 g/dL   HCT 54.0 (L) 98.1 - 19.1 %   MCV 92.6 80.0 - 100.0 fL   MCH 30.2 26.0 - 34.0 pg   MCHC 32.6 30.0 - 36.0 g/dL   RDW 47.8 29.5 - 62.1 %   Platelets 313 150 - 400 K/uL   nRBC 0.0 0.0 - 0.2 %   Neutrophils Relative % 76 %   Neutro Abs 7.8 (H) 1.7 - 7.7 K/uL   Lymphocytes Relative 16 %   Lymphs Abs 1.6 0.7 - 4.0 K/uL   Monocytes Relative 5 %   Monocytes Absolute 0.5 0.1 - 1.0 K/uL   Eosinophils Relative 2 %   Eosinophils Absolute 0.2 0.0 - 0.5 K/uL   Basophils Relative 0 %   Basophils Absolute 0.0 0.0 - 0.1 K/uL   Immature Granulocytes 1 %   Abs Immature Granulocytes 0.06 0.00 - 0.07 K/uL    Comment: Performed at [facility], [address], [place], [place] [address]  Basic metabolic panel     Status: Abnormal   Collection Time: 02/22/21  6:13 AM  Result Value Ref Range   Sodium 137 135 - 145 mmol/L   Potassium 3.1 (L) 3.5 - 5.1 mmol/L   Chloride 103 98 - 111  mmol/L   CO2 22 22 - 32 mmol/L   Glucose, Bld 202 (H) 70 - 99 mg/dL    Comment: Glucose reference range applies only to samples taken after fasting for at least 8 hours.   BUN 22 8 - 23 mg/dL   Creatinine, Ser 6.83 0.44 - 1.00 mg/dL   Calcium 8.8 (L) 8.9 - 10.3 mg/dL   GFR, Estimated 59 (L) >60 mL/min    Comment: (NOTE) Calculated using the CKD-EPI Creatinine Equation (2021)    Anion gap 12 5 - 15    Comment: Performed at [facility], [address] [place], [place] [address]  Resp Panel by RT-PCR (Flu A&B, Covid) Nasopharyngeal Swab     Status: None   Collection Time: 02/22/21  6:14 AM   Specimen: Nasopharyngeal Swab; Nasopharyngeal(NP) swabs in vial transport medium  Result Value Ref Range   SARS Coronavirus 2 by RT PCR NEGATIVE NEGATIVE    Comment: (NOTE) SARS-CoV-2 target nucleic acids are NOT DETECTED.  The SARS-CoV-2 RNA is generally detectable in upper respiratory specimens during the acute phase of infection. The lowest concentration of SARS-CoV-2 viral  copies this assay can detect is 138 copies/mL. A negative result does not preclude SARS-Cov-2 infection and should not be used as the sole basis for treatment or other patient management decisions. A negative result may occur with  improper specimen collection/handling, submission of specimen other than nasopharyngeal swab, presence of viral mutation(s) within the areas targeted by this assay, and inadequate number of viral copies(<138 copies/mL). A negative result must be combined with clinical observations, patient history, and epidemiological information. The expected result is Negative.  Fact Sheet for Patients:  BloggerCourse.com  Fact Sheet for Healthcare Providers:  SeriousBroker.it  This test is no t yet approved or cleared by the [place] FDA and  has been authorized for detection and/or diagnosis of SARS-CoV-2 by FDA under an Emergency Use Authorization (EUA). This EUA will remain  in effect (meaning this test can be used) for the duration of the COVID-19 declaration under Section 564(b)(1) of the Act, 21 U.S.C.section 360bbb-3(b)(1), unless the authorization is terminated  or revoked sooner.       Influenza A by PCR NEGATIVE NEGATIVE   Influenza B by PCR NEGATIVE NEGATIVE    Comment: (NOTE) The Xpert Xpress SARS-CoV-2/FLU/RSV plus assay is intended as an aid in the diagnosis of influenza from Nasopharyngeal swab specimens and should not be used as a sole basis for treatment. Nasal washings and aspirates are unacceptable for Xpert Xpress SARS-CoV-2/FLU/RSV testing.  Fact Sheet for Patients: BloggerCourse.com  Fact Sheet for Healthcare Providers: SeriousBroker.it  This test is not yet approved or cleared by the [place] FDA and has been authorized for detection and/or diagnosis of SARS-CoV-2 by FDA under an Emergency Use Authorization (EUA). This EUA will  remain in effect (meaning this test can be used) for the duration of the COVID-19 declaration under Section 564(b)(1) of the Act, 21 U.S.C. section 360bbb-3(b)(1), unless the authorization is terminated or revoked.  Performed at [facility], [address] [place], [place] [address]     DG Knee 1-2 Views Left  Result Date: 02/22/2021 CLINICAL DATA:  Fall EXAM: LEFT KNEE - 1-2 VIEW COMPARISON:  None. FINDINGS: There is a mildly displaced oblique fracture of the distal femoral metaphysis which extends towards the epiphysis without definite evidence of intra-articular extension. Knee alignment appears maintained. IMPRESSION: Mildly displaced fracture of the distal femoral metaphysis with extension towards the epiphysis  without definite evidence of intra-articular extension. Electronically Signed   By: Lesia Hausen [name]   On: 02/22/2021 08:12   DG Ankle Complete Right  Result Date: 02/22/2021 CLINICAL DATA:  88 year old female found down after fall. Right lower extremity deformity. EXAM: RIGHT ANKLE - COMPLETE 3+ VIEW COMPARISON:  Right ankle series 12/31/2018. FINDINGS: Mortise joint alignment appears stable since 2020, with osteopenia and no definite acute fracture of the distal tibia or fibula. Talus and calcaneus appear stable, with chronic pes planus and a degree of chronic midfoot collapse. Right foot series is reported separately today. Calcified peripheral vascular disease. IMPRESSION: Osteopenia with chronic pes planus and mid/hind-foot collapse. No definite acute fracture about the right ankle. Electronically Signed   By: Odessa Fleming [name]   On: 02/22/2021 07:15   DG Chest Portable 1 View  Result Date: 02/22/2021 CLINICAL DATA:  88 year old female found down after fall. Right lower extremity deformity. EXAM: PORTABLE CHEST 1 VIEW COMPARISON:  Portable chest 12/27/2020 and earlier. FINDINGS: Portable AP supine view at 0638 hours. Chronic large hiatal hernia with increased gastric air  today. Stable cardiac size and mediastinal contours. Calcified aortic atherosclerosis. No pneumothorax identified on this supine view. No pulmonary edema. No definite effusion. Chronic atelectasis associated with the hernia. Chronic appearing right lateral 5th rib fracture. No acute osseous abnormality identified. Negative visible bowel gas. IMPRESSION: 1. No acute cardiopulmonary abnormality. 2. Chronic large hiatal hernia. Electronically Signed   By: Odessa Fleming [name]   On: 02/22/2021 07:13   DG Knee Right Port  Result Date: 02/22/2021 CLINICAL DATA:  88 year old female found down after fall. Right ankle deformity. EXAM: PORTABLE RIGHT KNEE - 1-2 VIEW COMPARISON:  Right hip series today. Right knee series 12/31/2018. FINDINGS: Spiral fracture distal right femoral shaft. Overriding about 2.5 cm. 1/2 shaft width lateral displacement. Similar mild anterior displacement. Mild anterior and medial angulation. Comminution fragments better demonstrated on the right hip series today. Joint spaces and alignment at the right knee appears stable with chronic tricompartmental degeneration. Proximal right tibia and fibula appear stable and intact. Calcified peripheral vascular disease. IMPRESSION: 1. Spiral fracture of the distal right femoral shaft with mild displacement and angulation. Comminution fragments better demonstrated on right hip series today. 2. Right knee remains intact, with chronic tricompartmental degeneration. 3. Calcified peripheral vascular disease. Electronically Signed   By: Odessa Fleming [name]   On: 02/22/2021 07:11   DG Foot 2 Views Right  Result Date: 02/22/2021 CLINICAL DATA:  88 year old female found down after fall. Right lower extremity deformity. EXAM: RIGHT FOOT - 2 VIEW COMPARISON:  Right foot series 07/13/2009. FINDINGS: Diffuse osteopenia. Chronic pes planus and mid to hindfoot collapse which has not significantly changed from 20/11. No superimposed acute fracture or dislocation identified in the  right foot. Calcified peripheral vascular disease. IMPRESSION: 1. No acute fracture or dislocation identified about the right foot. 2. Chronic osteopenia, pes planus and mid or hindfoot collapse. Electronically Signed   By: Odessa Fleming [name]   On: 02/22/2021 07:16   DG Hip Unilat W or Wo Pelvis 2-3 Views Left  Result Date: 02/22/2021 CLINICAL DATA:  Fall EXAM: DG HIP (WITH OR WITHOUT PELVIS) 2-3V LEFT COMPARISON:  CT 01/06/2019. FINDINGS: Diffuse osteopenia. There is severe bilateral hip osteoarthritis with remodeling of the femoral heads and acetabuli bilaterally. There is no definite acute fracture, evaluation limited due to remodeled bone. IMPRESSION: Severe bilateral hip osteoarthritis with bony remodeling. No definite acute fracture. Note that if the patient is unable to  bear weight and clinical suspicion for hip fracture is significant, CT or MRI would be more sensitive. Electronically Signed   By: Caprice Renshaw [name]   On: 02/22/2021 08:13   DG FEMUR, MIN 2 VIEWS RIGHT  Result Date: 02/22/2021 CLINICAL DATA:  88 year old female found down after fall. Right ankle deformity. EXAM: RIGHT FEMUR 2 VIEWS COMPARISON:  Bilateral hip series 12/31/2018. FINDINGS: Comminuted spiral fracture of the distal right femur shaft is partially visible. Comminution fragments up to 2 cm individually. Lateral angulation. Advanced chronic degeneration at the right hip which appears stable since 2020. Grossly intact visible right hemipelvis. Calcified femoral artery atherosclerosis. IMPRESSION: 1. Comminuted spiral fracture of the distal right femur shaft is partially visible with lateral angulation and comminution. 2. Right hip appears stable since 2020. Electronically Signed   By: Odessa Fleming [name]   On: 02/22/2021 07:09    Review of Systems  Unable to perform ROS: Dementia  Musculoskeletal:  Positive for arthralgias (Bilateral legs).  Blood pressure 121/65, pulse 68, temperature 97.9 F (36.6 C), temperature source Oral, resp.  rate 19, SpO2 98 %. Physical Exam Constitutional:      General: She is not in acute distress.    Appearance: She is well-developed. She is not diaphoretic.  HENT:     Head: Normocephalic and atraumatic.  Eyes:     General: No scleral icterus.       Right eye: No discharge.        Left eye: No discharge.     Conjunctiva/sclera: Conjunctivae normal.  Cardiovascular:     Rate and Rhythm: Normal rate and regular rhythm.  Pulmonary:     Effort: Pulmonary effort is normal. No respiratory distress.  Musculoskeletal:     Cervical back: Normal range of motion.     Comments: RLE No traumatic wounds or rash, thigh is mottled  Severe TTP  No knee or ankle effusion  Knee stable to varus/ valgus and anterior/posterior stress  Sens DPN, SPN, TN grossly intact  Motor EHL, ext, flex, evers grossly intact  DP 1+, PT 0, No significant edema, Hindfoot deformity appears chronic  LLE No traumatic wounds, ecchymosis, or rash  Severe TTP distal thigh  No knee or ankle effusion  Sens DPN, SPN, TN intact  Motor EHL, ext, flex, evers 5/5  DP 2+, PT 1+, No significant edema  Skin:    General: Skin is warm and dry.  Neurological:     Mental Status: She is alert.  Psychiatric:        Mood and Affect: Mood normal.        Behavior: Behavior normal.    Assessment/Plan: Bilateral femur fxs -- Plan for palliative IMN today with Dr. Carola Frost. Please keep NPO. Multiple medical problems including dementia, hypothyroidism, HTN, and HLD -- Will ask medicine to admit and manage.    Freeman Caldron, PA-C Orthopedic Surgery [phone] 02/22/2021, 9:12 AM

## 2021-02-22 NOTE — Progress Notes (Signed)
Anesthesia consult for pain block noted.  After evaluation of patient and discussion with son, I believe this patient is not an acceptable candidate for this procedure because she has bilateral femur fractures and in scheduled for surgery in a few hours .  I discussed this with the son.

## 2021-02-22 NOTE — Progress Notes (Signed)
CRITICAL RESULT PROVIDER NOTIFICATION  Test performed and critical result:  Hgb 5.9  Date and time result received:  02/22/21, 2036  Provider name/title: Dr. Maple Hudson notified  Date and time provider notified: 2036  Date and time provider responded: 2036  Provider response:See new orders   Dr. Maple Hudson will place order for 2 units of blood but would like 3 units of blood prepared.

## 2021-02-22 NOTE — ED Triage Notes (Addendum)
BIB [facility] pt seen normal between 3-5 AM. Per EMS Pt found on ground. Pt remembers tripping over chair near bed. Denies thinners.  R ankle deformity with pulse present , trauma to R hip  20 R AC 100 mcg fentanyl given en route

## 2021-02-22 NOTE — ED Provider Notes (Signed)
[facility] EMERGENCY DEPARTMENT Provider Note   CSN: [id number] Arrival date & time: 02/22/21  0086     History  Chief Complaint  Patient presents with   Emily Camacho    Emily Camacho is a 88 y.o. female.  HPI     This is a 88 year old lady who presents by EMS following a fall.  Per EMS patient was found on the ground.  Last seen normal between 3 and 5 AM at her living facility.  She believes she tripped.  Not reportedly on blood thinners.  She was very awkwardly position per EMS.  They noted a deformity to the ankle on the right hip.  She was given 100 mg of fentanyl in route.  Patient cannot give me any details from the fall.  She is mainly complaining of right hip pain and right foot pain.  States she normally walks "every day."  She is oriented to herself.  Level 5 caveat Attempted to contact the patient's next of kin, son, Mellody Dance.  Left a message.  Home Medications Prior to Admission medications   Medication Sig Start Date End Date Taking? Authorizing Provider  acetaminophen (TYLENOL) 650 MG CR tablet Take 650 mg by mouth every 4 (four) hours as needed (general discomfort).    [provider]  aspirin 325 MG tablet Take 325 mg by mouth daily.    [provider]  azithromycin (ZITHROMAX) 250 MG tablet Take 1 tablet (250 mg total) by mouth daily. Take first 2 tablets together, then 1 every day until finished. 02/29/20   Roxy Horseman, PA-C  Cholecalciferol (VITAMIN D) 400 UNITS capsule Take 400 Units by mouth 2 (two) times daily.    [provider]  Ensure Plus (ENSURE PLUS) LIQD Take 237 mLs by mouth 3 (three) times daily.    [provider]  feeding supplement, ENSURE ENLIVE, (ENSURE ENLIVE) LIQD Take 237 mLs by mouth 2 (two) times daily between meals. Patient not taking: No sig reported 01/08/19   Elease Etienne, MD  ipratropium-albuterol (DUONEB) 0.5-2.5 (3) MG/3ML SOLN Take 3 mLs by nebulization every 8 (eight) hours as  needed (shortness of breath/wheezing). 11/22/19   [provider]  levocetirizine (XYZAL) 5 MG tablet Take 5 mg by mouth daily. 01/30/20   [provider]  levothyroxine (SYNTHROID) 25 MCG tablet Take 25 mcg by mouth daily. 01/15/20   [provider]  lisinopril (ZESTRIL) 20 MG tablet Take 1 tablet (20 mg total) by mouth daily. 01/08/19   Lanae Boast, MD  metoprolol succinate (TOPROL-XL) 25 MG 24 hr tablet Take 50 mg by mouth daily. 10/05/10   Lewayne Bunting, MD  Multiple Vitamins-Minerals (CENTRUM PO) 1 tab po qd    [provider]  ondansetron (ZOFRAN ODT) 4 MG disintegrating tablet Take 1 tablet (4 mg total) by mouth every 8 (eight) hours as needed for nausea or vomiting. 02/29/20   Roxy Horseman, PA-C  OVER THE COUNTER MEDICATION Take 120 mLs by mouth 3 (three) times daily. Med pass. --- for wound/weight    [provider]  oxybutynin (DITROPAN-XL) 5 MG 24 hr tablet Take 5 mg by mouth daily. 01/22/20   [provider]  rosuvastatin (CRESTOR) 5 MG tablet Take 5 mg by mouth at bedtime. 02/08/20   [provider]  traMADol (ULTRAM) 50 MG tablet Take 50 mg by mouth as needed for moderate pain or severe pain.    [provider]  zinc oxide 20 % ointment Apply 1 application  topically See admin instructions. Apply to sacrum every shift for wound    [provider]  zinc oxide 20 % ointment Apply 1 application topically as needed (sacrum wound treatment).    [provider]      Allergies    Penicillins, Sulfonamide derivatives, and Latex    Review of Systems   Review of Systems  Unable to perform ROS: Acuity of condition   Physical Exam Updated Vital Signs BP (!) 178/80    Pulse 90    Temp 97.9 F (36.6 C) (Oral)    Resp 20    SpO2 97%  Physical Exam Vitals and nursing note reviewed.  Constitutional:      Appearance: She is well-developed.     Comments: Elderly, ABCs intact  HENT:     Head:  Normocephalic and atraumatic.     Mouth/Throat:     Mouth: Mucous membranes are moist.  Eyes:     Pupils: Pupils are equal, round, and reactive to light.  Cardiovascular:     Rate and Rhythm: Normal rate and regular rhythm.     Heart sounds: Normal heart sounds.  Pulmonary:     Effort: Pulmonary effort is normal. No respiratory distress.     Breath sounds: No wheezing.  Abdominal:     Palpations: Abdomen is soft.     Tenderness: There is no abdominal tenderness.  Musculoskeletal:     Cervical back: Neck supple.     Comments: Obvious deformity of the lower extremity, ankle is externally rotated and everted when compared to the orientation of the knee, also pain of the mid femur, 1+ DP pulse  Skin:    General: Skin is warm and dry.     Comments: Slight purple discoloration of the entire right lower extremity  Neurological:     Mental Status: She is alert and oriented to person, place, and time.  Psychiatric:        Mood and Affect: Mood normal.     ED Results / Procedures / Treatments   Labs (all labs ordered are listed, but only abnormal results are displayed) Labs Reviewed  CBC WITH DIFFERENTIAL/PLATELET - Abnormal; Notable for the following components:      Result Value   RBC 3.64 (*)    Hemoglobin 11.0 (*)    HCT 33.7 (*)    Neutro Abs 7.8 (*)    All other components within normal limits  BASIC METABOLIC PANEL - Abnormal; Notable for the following components:   Potassium 3.1 (*)    Glucose, Bld 202 (*)    Calcium 8.8 (*)    GFR, Estimated 59 (*)    All other components within normal limits  RESP PANEL BY RT-PCR (FLU A&B, COVID) [id number]    EKG EKG Interpretation  Date/Time:  Tuesday February 22 2021 06:16:06 EST Ventricular Rate:  64 PR Interval:  216 QRS Duration: 105 QT Interval:  467 QTC Calculation: 482 R Axis:   67 Text Interpretation: Sinus rhythm Borderline prolonged PR interval Anterior infarct, old Borderline repolarization abnormality Confirmed by  Ross Marcus ([id number]) on 02/22/2021 7:19:50 AM  Radiology DG Ankle Complete Right  Result Date: 02/22/2021 CLINICAL DATA:  88 year old female found down after fall. Right lower extremity deformity. EXAM: RIGHT ANKLE - COMPLETE 3+ VIEW COMPARISON:  Right ankle series 12/31/2018. FINDINGS: Mortise joint alignment appears stable since 2020, with osteopenia and no definite acute fracture of the distal tibia or fibula. Talus and calcaneus appear stable, with chronic pes planus and a degree  of chronic midfoot collapse. Right foot series is reported separately today. Calcified peripheral vascular disease. IMPRESSION: Osteopenia with chronic pes planus and mid/hind-foot collapse. No definite acute fracture about the right ankle. Electronically Signed   By: Odessa Fleming [name]   On: 02/22/2021 07:15   DG Chest Portable 1 View  Result Date: 02/22/2021 CLINICAL DATA:  88 year old female found down after fall. Right lower extremity deformity. EXAM: PORTABLE CHEST 1 VIEW COMPARISON:  Portable chest 12/27/2020 and earlier. FINDINGS: Portable AP supine view at 0638 hours. Chronic large hiatal hernia with increased gastric air today. Stable cardiac size and mediastinal contours. Calcified aortic atherosclerosis. No pneumothorax identified on this supine view. No pulmonary edema. No definite effusion. Chronic atelectasis associated with the hernia. Chronic appearing right lateral 5th rib fracture. No acute osseous abnormality identified. Negative visible bowel gas. IMPRESSION: 1. No acute cardiopulmonary abnormality. 2. Chronic large hiatal hernia. Electronically Signed   By: Odessa Fleming [name]   On: 02/22/2021 07:13   DG Knee Right Port  Result Date: 02/22/2021 CLINICAL DATA:  88 year old female found down after fall. Right ankle deformity. EXAM: PORTABLE RIGHT KNEE - 1-2 VIEW COMPARISON:  Right hip series today. Right knee series 12/31/2018. FINDINGS: Spiral fracture distal right femoral shaft. Overriding about 2.5 cm. 1/2 shaft  width lateral displacement. Similar mild anterior displacement. Mild anterior and medial angulation. Comminution fragments better demonstrated on the right hip series today. Joint spaces and alignment at the right knee appears stable with chronic tricompartmental degeneration. Proximal right tibia and fibula appear stable and intact. Calcified peripheral vascular disease. IMPRESSION: 1. Spiral fracture of the distal right femoral shaft with mild displacement and angulation. Comminution fragments better demonstrated on right hip series today. 2. Right knee remains intact, with chronic tricompartmental degeneration. 3. Calcified peripheral vascular disease. Electronically Signed   By: Odessa Fleming [name]   On: 02/22/2021 07:11   DG Foot 2 Views Right  Result Date: 02/22/2021 CLINICAL DATA:  88 year old female found down after fall. Right lower extremity deformity. EXAM: RIGHT FOOT - 2 VIEW COMPARISON:  Right foot series 07/13/2009. FINDINGS: Diffuse osteopenia. Chronic pes planus and mid to hindfoot collapse which has not significantly changed from 20/11. No superimposed acute fracture or dislocation identified in the right foot. Calcified peripheral vascular disease. IMPRESSION: 1. No acute fracture or dislocation identified about the right foot. 2. Chronic osteopenia, pes planus and mid or hindfoot collapse. Electronically Signed   By: Odessa Fleming [name]   On: 02/22/2021 07:16   DG FEMUR, MIN 2 VIEWS RIGHT  Result Date: 02/22/2021 CLINICAL DATA:  88 year old female found down after fall. Right ankle deformity. EXAM: RIGHT FEMUR 2 VIEWS COMPARISON:  Bilateral hip series 12/31/2018. FINDINGS: Comminuted spiral fracture of the distal right femur shaft is partially visible. Comminution fragments up to 2 cm individually. Lateral angulation. Advanced chronic degeneration at the right hip which appears stable since 2020. Grossly intact visible right hemipelvis. Calcified femoral artery atherosclerosis. IMPRESSION: 1. Comminuted  spiral fracture of the distal right femur shaft is partially visible with lateral angulation and comminution. 2. Right hip appears stable since 2020. Electronically Signed   By: Odessa Fleming [name]   On: 02/22/2021 07:09    Procedures Procedures    Medications Ordered in ED Medications - No data to display  ED Course/ Medical Decision Making/ A&P Clinical Course as of 02/22/21 9528  Tue Feb 22, 2021  0709 Spoke to patient's son, Mellody Dance.  Indicates that the patient does have a living will.  He believes "nothing should be done" potentially including surgery.  However he wants to discuss with his brother who has a living will in his possession. [CH]    Clinical Course User Index [CH] Aswad Wandrey, Mayer Masker, MD                           Medical Decision Making Amount and/or Complexity of Data Reviewed Labs: ordered. Radiology: ordered.   This patient presents to the ED for concern of leg injury following a fall, this involves an extensive number of treatment options, and is a complaint that carries with it a high risk of complications and morbidity.  The differential diagnosis includes femur fracture, hip fracture, dislocation, ankle injury, other traumatic injury  MDM:    This is a 88 year old female who presents following a fall.  Was found awkwardly on the floor with her right leg underneath her.  She is neurovascular intact.  Her lower leg is externally rotated and orientation to her proximal leg.  Very minimal movement on physical exam.  She is able to wiggle her toes and 1+ DP pulse.  There is slight discoloration of the right leg.  X-rays obtained.  It appears that mostly the rotation is at the distal spiral femur fracture.  Radiology reads confirm this.  She has a chronic deformity of the right foot likely accounting for the deformity noted on exam.  She remains neurologically intact.  See discussion above with her son.  CT head and neck pending.  Lab work obtained and largely reassuring.  She  will require admission and orthopedic evaluation peer (Labs, imaging)  Labs: I Ordered, and personally interpreted labs.  The pertinent results include: CBC, BMP, COVID testing  Imaging Studies ordered: I ordered imaging studies including x-rays of the right lower extremity with spiral femur fracture I independently visualized and interpreted imaging. I agree with the radiologist interpretation  Additional history obtained from patient.  External records from outside source obtained and reviewed including chart reviewed  Critical Interventions: IV pain medication  Consultations: I requested consultation with the orthopedics,  and discussed lab and imaging findings as well as pertinent plan - they recommend: Pending  Cardiac Monitoring: The patient was maintained on a cardiac monitor.  I personally viewed and interpreted the cardiac monitored which showed an underlying rhythm of: Normal sinus rhythm  Reevaluation: After the interventions noted above, I reevaluated the patient and found that they have :improved   Considered admission for: Femur fracture  Social Determinants of Health: Elderly, lives in nursing home  Disposition: Admit  Co morbidities that complicate the patient evaluation  Past Medical History:  Diagnosis Date   Arthritis    Cardiomyopathy    tako tsubo   CHF (congestive heart failure) (HCC)    Dyslipidemia    Epistaxis    GERD (gastroesophageal reflux disease)    HTN (hypertension)    Renal disorder      Medicines No orders of the defined types were placed in this encounter.   I have reviewed the patients home medicines and have made adjustments as needed  Problem List / ED Course: Problem List Items Addressed This Visit   None Visit Diagnoses     Closed displaced spiral fracture of shaft of right femur, initial encounter (HCC)    -  Primary   Deformity       Relevant Orders   DG Foot 2 Views Right (Completed)   DG Knee Right Port  (  Completed)   DG FEMUR, MIN 2 VIEWS RIGHT (Completed)                   Final Clinical Impression(s) / ED Diagnoses Final diagnoses:  Closed displaced spiral fracture of shaft of right femur, initial encounter [facility])    Rx / DC Orders ED Discharge Orders     None         Yazen Rosko, Mayer Masker, MD 02/22/21 [phone]

## 2021-02-23 ENCOUNTER — Encounter (HOSPITAL_COMMUNITY): Payer: Self-pay | Admitting: Orthopedic Surgery

## 2021-02-23 DIAGNOSIS — S72341A Displaced spiral fracture of shaft of right femur, initial encounter for closed fracture: Secondary | ICD-10-CM | POA: Diagnosis not present

## 2021-02-23 LAB — CBC
HCT: 20.9 % — ABNORMAL LOW (ref 36.0–46.0)
Hemoglobin: 7.3 g/dL — ABNORMAL LOW (ref 12.0–15.0)
MCH: 30.7 pg (ref 26.0–34.0)
MCHC: 34.9 g/dL (ref 30.0–36.0)
MCV: 87.8 fL (ref 80.0–100.0)
Platelets: 90 10*3/uL — ABNORMAL LOW (ref 150–400)
RBC: 2.38 MIL/uL — ABNORMAL LOW (ref 3.87–5.11)
RDW: 14.7 % (ref 11.5–15.5)
WBC: 5.5 10*3/uL (ref 4.0–10.5)
nRBC: 0 % (ref 0.0–0.2)

## 2021-02-23 LAB — SAVE SMEAR(SSMR), FOR PROVIDER SLIDE REVIEW

## 2021-02-23 LAB — VITAMIN D 25 HYDROXY (VIT D DEFICIENCY, FRACTURES): Vit D, 25-Hydroxy: 50.72 ng/mL (ref 30–100)

## 2021-02-23 LAB — BASIC METABOLIC PANEL
Anion gap: 8 (ref 5–15)
BUN: 23 mg/dL (ref 8–23)
CO2: 25 mmol/L (ref 22–32)
Calcium: 7.8 mg/dL — ABNORMAL LOW (ref 8.9–10.3)
Chloride: 106 mmol/L (ref 98–111)
Creatinine, Ser: 1.09 mg/dL — ABNORMAL HIGH (ref 0.44–1.00)
GFR, Estimated: 48 mL/min — ABNORMAL LOW (ref >60)
Glucose, Bld: 142 mg/dL — ABNORMAL HIGH (ref 70–99)
Potassium: 4.1 mmol/L (ref 3.5–5.1)
Sodium: 139 mmol/L (ref 135–145)

## 2021-02-23 LAB — CBC WITH DIFFERENTIAL/PLATELET

## 2021-02-23 LAB — HEMOGLOBIN AND HEMATOCRIT, BLOOD
HCT: 22.5 % — ABNORMAL LOW (ref 36.0–46.0)
Hemoglobin: 7.6 g/dL — ABNORMAL LOW (ref 12.0–15.0)

## 2021-02-23 LAB — PREPARE RBC (CROSSMATCH)

## 2021-02-23 MED ORDER — SODIUM CHLORIDE 0.9% IV SOLUTION: Freq: Once | INTRAVENOUS | Status: AC

## 2021-02-23 MED ORDER — ACETAMINOPHEN 325 MG PO TABS: 650.0000 mg | ORAL_TABLET | Freq: Once | ORAL | Status: DC

## 2021-02-23 MED ORDER — FUROSEMIDE 10 MG/ML IJ SOLN: 20.0000 mg | Freq: Once | INTRAMUSCULAR | Status: DC

## 2021-02-23 MED ORDER — POTASSIUM CHLORIDE 10 MEQ/100ML IV SOLN
10.0000 meq | INTRAVENOUS | Status: AC
  Administered 2021-02-23 (×3): 10 meq via INTRAVENOUS
  Filled 2021-02-23 (×3): qty 100

## 2021-02-23 NOTE — Progress Notes (Signed)
Called patient's son, Mellody Dance- left voice message to return call.  Antony Blackbird, MSW, LCSW Clinical Social Worker

## 2021-02-23 NOTE — Progress Notes (Signed)
Pt arrive to 4NP10 with no belongings, second unit of blood infusing, full assessment and VS documented. Bed alarm on, bed in lowest position, call bell in reach

## 2021-02-23 NOTE — Evaluation (Addendum)
Clinical/Bedside Swallow Evaluation Patient Details  Name: Emily Camacho MRN: [id number] Date of Birth: 1928-11-10  Today's Date: 02/23/2021 Time: SLP Start Time (ACUTE ONLY): [id number] SLP Stop Time (ACUTE ONLY): 0910 SLP Time Calculation (min) (ACUTE ONLY): 18 min  Past Medical History:  Past Medical History:  Diagnosis Date   Arthritis    Cardiomyopathy    tako tsubo   CHF (congestive heart failure) (HCC)    Dyslipidemia    Epistaxis    GERD (gastroesophageal reflux disease)    HTN (hypertension)    Renal disorder    Past Surgical History:  Past Surgical History:  Procedure Laterality Date   CATARACT EXTRACTION     Bilateral eyes   KNEE SURGERY  90's   TOE SURGERY     TOTAL ABDOMINAL HYSTERECTOMY W/ BILATERAL SALPINGOOPHORECTOMY     HPI:  Pt is a 88 y.o. female who presented after having an unwitnessed fall at the SNF. Patient found to have bilateral femur fxs and orthopedic surgery consulted and pt s/p IMN of Lef and right femur 1/31. Pt was intubated for procedure and a iet odered thereafter. XCXR onadmission negative PMH: hypertension, CHF, hypothyroidism, dementia, and GERD. Pt last seen by SLP in December, 2020 and a dysphagia 3 diet with thin liquids was recommended at that time.    Assessment / Plan / Recommendation  Clinical Impression  Pt was seen for bedside swallow evaluation with her daughter and son-in-law present. Pt and her family denied the pt having a history of dysphagia. Pt reported that she inconsistently wears maxillary dentures and that she therefore often consumes softer foods with meats chopped, or occasionally ground. Oral mechanism exam was [facility] and she presented with limited anterior mandibular teeth only. Pt's family reported that dentures are not currently at the hospital. She tolerated all solids and liquids without signs or symptoms of oropharyngeal dysphagia. A dysphagia 3 diet with thin liquids is recommended at this time and SLP will follow briefly to  ensure tolerance. SLP Visit Diagnosis: Dysphagia, unspecified (R13.10)    Aspiration Risk  Mild aspiration risk    Diet Recommendation Dysphagia 3 (Mech soft);Thin liquid   Liquid Administration via: Cup;Straw Medication Administration: Whole meds with liquid Supervision: Patient able to self feed Compensations: Minimize environmental distractions Postural Changes: Seated upright at 90 degrees    Other  Recommendations Oral Care Recommendations: Oral care BID    Recommendations for follow up therapy are one component of a multi-disciplinary discharge planning process, led by the attending physician.  Recommendations may be updated based on patient status, additional functional criteria and insurance authorization.  Follow up Recommendations No SLP follow up      Assistance Recommended at Discharge    Functional Status Assessment Patient has not had a recent decline in their functional status  Frequency and Duration min 1 x/week  1 week       Prognosis Prognosis for Safe Diet Advancement: Good      Swallow Study   General Date of Onset: 02/22/21 HPI: Pt is a 88 y.o. female who presented after having an unwitnessed fall at the SNF. Patient found to have bilateral femur fxs and orthopedic surgery consulted and pt s/p IMN of Lef and right femur 1/31. Pt was intubated for procedure and a iet odered thereafter. XCXR onadmission negative PMH: hypertension, CHF, hypothyroidism, dementia, and GERD. Pt last seen by SLP in December, 2020 and a dysphagia 3 diet with thin liquids was recommended at that time. Type of Study: Bedside Swallow  Evaluation Previous Swallow Assessment: See HPI Diet Prior to this Study: Dysphagia 3 (soft);Thin liquids Temperature Spikes Noted: No Respiratory Status: Nasal cannula History of Recent Intubation: Yes Length of Intubations (days):  (for procedure) Date extubated: 02/22/21 Behavior/Cognition: Alert;Cooperative;Pleasant mood;Confused;Requires  cueing Oral Cavity Assessment: Within Functional Limits Oral Care Completed by SLP: No Oral Cavity - Dentition: Missing dentition Vision: Functional for self-feeding Self-Feeding Abilities: Needs assist Patient Positioning: Upright in bed;Postural control adequate for testing Baseline Vocal Quality: Hoarse Volitional Cough: Strong Volitional Swallow: Able to elicit    Oral/Motor/Sensory Function Overall Oral Motor/Sensory Function: Within functional limits   Ice Chips Ice chips: Within functional limits Presentation: Spoon   Thin Liquid Thin Liquid: Within functional limits Presentation: Straw    Nectar Thick Nectar Thick Liquid: Not tested   Honey Thick Honey Thick Liquid: Not tested   Puree Puree: Within functional limits Presentation: Spoon   Solid     Solid: Within functional limits Presentation: [place] [address] [place], [place], [place] Office number [phone] Pager [phone]  Horton Marshall 02/23/2021,9:20 AM

## 2021-02-23 NOTE — Progress Notes (Signed)
Orthopaedic Trauma Service Progress Note  Patient ID: Emily Camacho MRN: [id number] DOB/AGE: September 18, 1928 88 y.o.  Subjective:  Doing ok this am  Pain controlled per pts report  Pleasantly confused  Daughter at bedside  She confirms that she has not ambulated in about 2 years or so  States she had a fall last year which resulted in R knee pain but pt refused xrays (looks like she has a healed distal femur fracture on the right) Pt confined to bed or WC  Pt did receive 2 units PRBCs yesterday in PACU  Not surprising given B femur fractures   Pt did get TXA post op as well    ROS As above  Objective:   VITALS:   Vitals:   02/22/21 2315 02/22/21 2337 02/23/21 0307 02/23/21 0754  BP: (!) 155/60 (!) 146/66 (!) 177/72 (!) 72/57  Pulse: 87 75 86 70  Resp: (!) 22 17 15 17   Temp: (!) 97.3 F (36.3 C) 97.9 F (36.6 C) 99.1 F (37.3 C) 98.9 F (37.2 C)  TempSrc:  Oral Oral Oral  SpO2: 92% 100% 100% 98%  Weight:      Height:        Estimated body mass index is 25.95 kg/m as calculated from the following:   Height as of this encounter: 4\' 9"  (1.448 m).   Weight as of this encounter: 54.4 kg.   Intake/Output      01/31 0701 02/01 0700 02/01 0701 02/02 0700   I.V. (mL/kg) 1300 (23.9)    Blood 140    IV Piggyback 765.8    Total Intake(mL/kg) 2205.8 (40.5)    Urine (mL/kg/hr)  400 (4)   Blood 100    Total Output 100 400   Net +2105.8 -400          LABS  Results for orders placed or performed during the hospital encounter of 02/22/21 (from the past 24 hour(s))  Hemoglobin & Hematocrit, Blood     Status: Abnormal   Collection Time: 02/22/21  7:46 PM  Result Value Ref Range   Hemoglobin 5.9 (LL) 12.0 - 15.0 g/dL   HCT 18.9 (L) 36.0 - 46.0 %  Type and screen [place]     Status: None (Preliminary result)   Collection Time: 02/22/21  7:55 PM  Result Value Ref Range    ABO/RH(D) A POS    Antibody Screen NEG    Sample Expiration 02/25/2021,2359    Unit Number [id number]    Blood Component Type RED CELLS,LR    Unit division 00    Status of Unit ALLOCATED    Transfusion Status OK TO TRANSFUSE    Crossmatch Result      Compatible Performed at [facility], [address] [place], [place] [address]    Unit Number [id number]    Blood Component Type RED CELLS,LR    Unit division 00    Status of Unit ISSUED    Transfusion Status OK TO TRANSFUSE    Crossmatch Result Compatible    Unit Number [id number]    Blood Component Type RBC LR PHER1    Unit division 00    Status of Unit ISSUED    Transfusion Status OK TO TRANSFUSE    Crossmatch Result Compatible   Prepare RBC (crossmatch)  Status: None   Collection Time: 02/22/21  8:48 PM  Result Value Ref Range   Order Confirmation      ORDER PROCESSED BY BLOOD BANK Performed at [facility], [place] [address], [place], [place] [id number]   Basic metabolic panel     Status: Abnormal   Collection Time: 02/23/21  6:29 AM  Result Value Ref Range   Sodium 139 135 - 145 mmol/L   Potassium 4.1 3.5 - 5.1 mmol/L   Chloride 106 98 - 111 mmol/L   CO2 25 22 - 32 mmol/L   Glucose, Bld 142 (H) 70 - 99 mg/dL   BUN 23 8 - 23 mg/dL   Creatinine, Ser 1.09 (H) 0.44 - 1.00 mg/dL   Calcium 7.8 (L) 8.9 - 10.3 mg/dL   GFR, Estimated 48 (L) >60 mL/min   Anion gap 8 5 - 15     PHYSICAL EXAM:   Gen: sitting up in bed, pleasant  Lungs: unlabored Ext:       B Lower Extremities   Dressings B legs stable   Moderate strikethrough noted    Chronic deformities noted to R leg    Valgus resting position B legs   Mild swelling to legs   Motor and sensory functions appear to be grossly intact   Exts are warm    + DP pulses   Assessment/Plan: 1 Day Post-Op   Anti-infectives (From admission, onward)    Start     Dose/Rate Route Frequency Ordered Stop   02/23/21 0000  clindamycin (CLEOCIN) IVPB 600  mg        600 mg 100 mL/hr over 30 Minutes Intravenous Every 6 hours 02/22/21 2331 02/23/21 1159   02/22/21 1400  vancomycin (VANCOCIN) IVPB 1000 mg/200 mL premix        1,000 mg 200 mL/hr over 60 Minutes Intravenous On call to O.R. 02/22/21 1342 02/22/21 1521     .  POD/HD#: 1  88 y/o female s/p fall with B femur fractures   -B femur fractures, non-ambulator s/p B retrograde IMN  NWB B LEX  Motion as tolerated    Pt has severe arthritis B hips    Contracture R knee joint---> knee moves to no more than 50 degrees     Therapy evals  Dressing changes as needed    ? Palliative care consult to establish goals    Compressing socks for swelling   - Pain management:  Multimodal  Minimize narcotics  - ABL anemia/Hemodynamics  Hgb 7.3  BP soft   Mild elevation of Cr  I do think pt will continue to drift down as she is only POD1   Transfuse additional 2 units today   - Medical issues   Thrombocytopenia     Likely consumptive/hemodilution from surgery    Hold lovenox for now   - DVT/PE prophylaxis:  Holding lovenox   TED hose - ID:   Periop abx  - Metabolic Bone Disease:  B femur fragility fractures = osteoporosis   Check vitamin d levels  Has been non-ambulatory for 2 years so bone quality is extremely poor   - Activity:  Bed to chair with assist     - Impediments to fracture healing:  Poor bone quality/osteoporosis    - Dispo:  Ortho issues stable  2 additional units PRBCs today   Therapy evals      Jari Pigg, PA-C [phone] (C) 02/23/2021, 8:49 AM  [facility] [place]  [place] [address] [phone] (O) [phone] (F)    After 5pm and on the weekends please log on to Amion, go to orthopaedics and the look under the [facility] Call for the provider(s) on call. You can also call our office at [phone] and then follow the prompts to be connected to the call team.   Patient ID: Emily Camacho,  female   DOB: 01-18-1929, 88 y.o.   MRN: [id number]

## 2021-02-23 NOTE — Progress Notes (Signed)
Talked with Dr. Margo Aye and she wants to transfuse 1 unit and then do an H and H, just so she doesn't have a fluid overload. Will give tylenol prior to infusion.

## 2021-02-23 NOTE — TOC Initial Note (Signed)
Transition of Care [facility]) - Initial/Assessment Note    Patient Details  Name: Emily Camacho MRN: [id number] Date of Birth: 06-10-1928  [facility]) CM/SW Contact:    Eduard Roux, LCSW Phone Number: 02/23/2021, 2:00 PM  Clinical Narrative:                  CSW spoke with patient's son,Keith. CSW introduced self and explained role. Mellody Dance confirmed patient is from [facility] long term, now, known as [facility] for over 2 year. He expects her to return once she is medially stable. Family states no questions or concerns at this time.   Antony Blackbird, MSW, LCSW Clinical Social Worker    Expected Discharge Plan: Skilled Nursing Facility Barriers to Discharge: Continued Medical Work up   Patient Goals and CMS Choice        Expected Discharge Plan and Services Expected Discharge Plan: Skilled Nursing Facility In-house Referral: Clinical Social Work     Living arrangements for the past 2 months: Skilled Nursing Facility                                      Prior Living Arrangements/Services Living arrangements for the past 2 months: Skilled Nursing Facility Lives with:: Facility Resident Patient language and need for interpreter reviewed:: No              Criminal Activity/Legal Involvement Pertinent to Current Situation/Hospitalization: No - Comment as needed  Activities of Daily Living      Permission Sought/Granted Permission sought to share information with : Family Supports    Share Information with NAME: Prestyn Mahn  Permission granted to share info w AGENCY: SNFs  Permission granted to share info w Relationship: brother  Permission granted to share info w Contact Information: [phone]  Emotional Assessment   Attitude/Demeanor/Rapport: Unable to Assess Affect (typically observed): Unable to Assess Orientation: : Oriented to Self, Oriented to Place, Oriented to  Time, Oriented to Situation Alcohol / Substance Use: Not  Applicable Psych Involvement: No (comment)  Admission diagnosis:  Deformity [Q89.9] Fall [W19.XXXA] Closed displaced spiral fracture of shaft of femur (HCC) [S72.343A] Closed displaced spiral fracture of shaft of right femur, initial encounter (HCC) [S72.341A] Femur fracture (HCC) [S72.90XA] Patient Active Problem List   Diagnosis Date Noted   Closed displaced spiral fracture of shaft of femur (HCC) 02/22/2021   Bedbound 02/22/2021   Closed fracture of left distal femur (HCC) 02/22/2021   Hyperglycemia 02/22/2021   Femur fracture (HCC) 02/22/2021   Traumatic rhabdomyolysis (HCC)    Pressure injury of skin 01/04/2019   Malnutrition of moderate degree 01/03/2019   Fall    Goals of care, counseling/discussion    Palliative care by specialist    DNR (do not resuscitate) discussion    AKI (acute kidney injury) (HCC)    Dehydration 12/31/2018   CARDIOMYOPATHY 09/20/2009   Hyperlipidemia 09/15/2009   Essential hypertension 09/15/2009   GERD 09/15/2009   ARTHRITIS 09/15/2009   EPISTAXIS 09/15/2009   CHEST PAIN 09/15/2009   PCP:  Koren Shiver, DO Pharmacy:   [facility] [phone] - [place], [place] - [address] [place] [address] Phone: [phone] Fax: [phone]     Social Determinants of Health (SDOH) Interventions    Readmission Risk Interventions No flowsheet data found.

## 2021-02-23 NOTE — Evaluation (Signed)
Physical Therapy Evaluation Patient Details Name: Emily UNCAPHER MRN: [id number] DOB: January 25, 1928 Today's Date: 02/23/2021  History of Present Illness  88 y.o. female with medical history significant of hypertension, CHF, hypothyroidism, dementia, and GERD presents after having an unwitnessed fall at the nursing facility where she resides.  At baseline patient has pretty significant dementia and has been bedbound for 2 years per daughter.  Patient underwent B IM nails of lower extremities with NWB to both.  Clinical Impression  Pt presents to PT with significant pain and ROM limitations in BLE. Per reports of pt's daughter the pt has remained in bed for the majority of her time over the last 2 years, with significant LE pain with limited mobility even prior to this fall. Pt demonstrates significant knee and hip flexion ROM deficits along with R foot external rotation deformity. Pt with great discomfort with attempts at LE PROM. Due to LE ROM deficits and pain the pt is likely unable to tolerate hoyer lift transfer. If the pt is able to tolerate hoyer lift transfer it is unlikely she would be able to maintain a seated position in a wheelchair or recliner due to LE flexion ROM deficits. Pt's LE ROM deficits appear chronic at this time based on family reports. Pt does not demonstrate the ability to make significant functional gains in the acute setting. Acute PT signing off.       Recommendations for follow up therapy are one component of a multi-disciplinary discharge planning process, led by the attending physician.  Recommendations may be updated based on patient status, additional functional criteria and insurance authorization.  Follow Up Recommendations No PT follow up    Assistance Recommended at Discharge Frequent or constant Supervision/Assistance  Patient can return home with the following  Two people to help with walking and/or transfers;A lot of help with bathing/dressing/bathroom (pt to  return to SNF)    Equipment Recommendations None recommended by PT (defer to SNF)  Recommendations for Other Services       Functional Status Assessment Patient has not had a recent decline in their functional status     Precautions / Restrictions Precautions Precautions: Fall Precaution Comments: Skin integrity Restrictions Weight Bearing Restrictions: Yes RLE Weight Bearing: Non weight bearing LLE Weight Bearing: Non weight bearing Other Position/Activity Restrictions: deformity to R shoulder and R ankle.      Mobility  Bed Mobility Overal bed mobility: Needs Assistance Bed Mobility: Rolling Rolling: Total assist, +2 for physical assistance              Transfers Overall transfer level:  (deferred due to pain with limited bed mobility. PT would not recommend use of hoyer lift for transfers as pt has limited hip flexion ROM and reports significant pain with limited LE PROM. Pt is likely to slide out of recliner or wheelchair 2/2 poor ROM)                      Ambulation/Gait                  Stairs            Wheelchair Mobility    Modified Rankin (Stroke Patients Only)       Balance  Pertinent Vitals/Pain Pain Assessment Pain Assessment: Faces Faces Pain Scale: Hurts whole lot Pain Location: BLE Pain Descriptors / Indicators: Grimacing Pain Intervention(s): Monitored during session    Home Living Family/patient expects to be discharged to:: Skilled nursing facility                        Prior Function Prior Level of Function : Needs assist  Cognitive Assist : Mobility (cognitive);ADLs (cognitive)   ADLs (Cognitive): Intermittent cues Physical Assist : Mobility (physical);ADLs (physical)   ADLs (physical): Bathing;Dressing;Toileting;IADLs Mobility Comments: per daughter pt has remained in the bed for the last 2 years. Daughter is unable to recall if the  pt had been transferred out of bed at all during this time. ADLs Comments: per daughter patient is able to feed herself after setup and perform basic grooming.  Bedlevel ADL     Hand Dominance   Dominant Hand: Right    Extremity/Trunk Assessment   Upper Extremity Assessment Upper Extremity Assessment: Defer to OT evaluation RUE Deficits / Details: R shoulder deformity    Lower Extremity Assessment Lower Extremity Assessment: Generalized weakness;RLE deficits/detail;LLE deficits/detail RLE Deficits / Details: pt with functional ankle ROM, knee and hip ROM grossly limited with knee flexion <20 degrees PROM (pain limiting), and passive hip flexion limited to ~45 degrees. LLE Deficits / Details: pt with functional ankle ROM, knee and hip ROM grossly limited with knee flexion <20 degrees PROM (pain limiting), and passive hip flexion limited to ~45 degrees.    Cervical / Trunk Assessment Cervical / Trunk Assessment: Kyphotic  Communication   Communication: No difficulties  Cognition Arousal/Alertness: Awake/alert Behavior During Therapy: WFL for tasks assessed/performed Overall Cognitive Status: History of cognitive impairments - at baseline                                 General Comments: pt reports she walked down to the pharmacy yesterday. Pt has been bed bound for the last 2 years per family report        General Comments General comments (skin integrity, edema, etc.): VSS on RA, multiple bandages on knees from abrasions    Exercises     Assessment/Plan    PT Assessment Patient does not need any further PT services  PT Problem List         PT Treatment Interventions      PT Goals (Current goals can be found in the Care Plan section)       Frequency       Co-evaluation PT/OT/SLP Co-Evaluation/Treatment: Yes Reason for Co-Treatment: Complexity of the patient's impairments (multi-system involvement);For patient/therapist safety PT goals addressed  during session: Mobility/safety with mobility;Strengthening/ROM;Proper use of DME OT goals addressed during session: ADL's and self-care       AM-PAC PT "6 Clicks" Mobility  Outcome Measure Help needed turning from your back to your side while in a flat bed without using bedrails?: Total Help needed moving from lying on your back to sitting on the side of a flat bed without using bedrails?: Total Help needed moving to and from a bed to a chair (including a wheelchair)?: Total Help needed standing up from a chair using your arms (e.g., wheelchair or bedside chair)?: Total Help needed to walk in hospital room?: Total Help needed climbing 3-5 steps with a railing? : Total 6 Click Score: 6    End of Session   Activity Tolerance: Patient  limited by pain Patient left: in bed;with call bell/phone within reach;with bed alarm set;with family/visitor present Nurse Communication: Mobility status (would only recommend lateral slide transfer from flat bed to other flat surface. Pt is likely unable to maintain sitting position due to pain and insufficient hip/knee flexion) PT Visit Diagnosis: Other abnormalities of gait and mobility (R26.89);Pain Pain - Right/Left:  (BLE) Pain - part of body: Hip;Knee    Time: 9518-8416 PT Time Calculation (min) (ACUTE ONLY): 13 min   Charges:   PT Evaluation $PT Eval Low Complexity: 1 Low          Arlyss Gandy, PT, DPT Acute Rehabilitation Pager: [phone] Office [phone]   Arlyss Gandy 02/23/2021, 11:07 AM

## 2021-02-23 NOTE — Evaluation (Signed)
Occupational Therapy Evaluation Patient Details Name: Emily Camacho MRN: [id number] DOB: [birth year] Today's Date: 02/23/2021   History of Present Illness 88 y.o. female with medical history significant of hypertension, CHF, hypothyroidism, dementia, and GERD presents after having an unwitnessed fall at the nursing facility where she resides.  At baseline patient has pretty significant dementia and has been bedbound for 2 years per daughter.  Patient underwent B IM nails of lower extremities with NWB to both.   Clinical Impression   Patient admitted for the diagnosis and procedure above.  PTA she resides at a local SNF, and required near dependent care for all mobility and self care except self feeding.  Per the daughter, patient is bedbound and unable to be moved secondary to pain.  OT recommends return to SNF for possible care planning screen from OT, and prior level of assist.        Recommendations for follow up therapy are one component of a multi-disciplinary discharge planning process, led by the attending physician.  Recommendations may be updated based on patient status, additional functional criteria and insurance authorization.   Follow Up Recommendations  Long-term institutional care without follow-up therapy    Assistance Recommended at Discharge Frequent or constant Supervision/Assistance  Patient can return home with the following      Functional Status Assessment  Patient has not had a recent decline in their functional status  Equipment Recommendations  None recommended by OT    Recommendations for Other Services       Precautions / Restrictions Precautions Precautions: Fall Precaution Comments: Skin integrity Restrictions Weight Bearing Restrictions: Yes RLE Weight Bearing: Non weight bearing LLE Weight Bearing: Non weight bearing Other Position/Activity Restrictions: deformity to R shoulder and R ankle.      Mobility Bed Mobility Overal bed mobility: Needs  Assistance Bed Mobility: Rolling Rolling: Total assist, +2 for physical assistance              Transfers Overall transfer level: Needs assistance                 General transfer comment: unable due to pain      Balance                                           ADL either performed or assessed with clinical judgement   ADL Overall ADL's : At baseline                                       General ADL Comments: total assist at bedlevel     Vision Baseline Vision/History: 0 No visual deficits Patient Visual Report: No change from baseline Vision Assessment?: No apparent visual deficits     Perception Perception Perception: Not tested   Praxis Praxis Praxis: Not tested    Pertinent Vitals/Pain Pain Assessment Pain Assessment: Faces Faces Pain Scale: Hurts whole lot Pain Location: Primarily lower extremities Pain Descriptors / Indicators: Sharp, Guarding, Grimacing Pain Intervention(s): Monitored during session     Hand Dominance Right   Extremity/Trunk Assessment Upper Extremity Assessment Upper Extremity Assessment: Generalized weakness;RUE deficits/detail RUE Deficits / Details: R shoulder deformity       Cervical / Trunk Assessment Cervical / Trunk Assessment: Kyphotic   Communication Communication Communication: No difficulties   Cognition Arousal/Alertness: Awake/alert  Behavior During Therapy: WFL for tasks assessed/performed Overall Cognitive Status: History of cognitive impairments - at baseline                                       General Comments   VSS on RA     Home Living Family/patient expects to be discharged to:: Skilled nursing facility                                        Prior Functioning/Environment Prior Level of Function : Needs assist  Cognitive Assist : Mobility (cognitive);ADLs (cognitive)   ADLs (Cognitive): Intermittent cues Physical  Assist : Mobility (physical);ADLs (physical)   ADLs (physical): Bathing;Dressing;Toileting;IADLs   ADLs Comments: per daughter patient is able to feed herself after setup and perform basic grooming.  Bedlevel ADL        OT Problem List: Decreased range of motion;Decreased knowledge of precautions;Decreased cognition;Pain      OT Treatment/Interventions:      OT Goals(Current goals can be found in the care plan section) Acute Rehab OT Goals OT Goal Formulation: Patient unable to participate in goal setting Time For Goal Achievement: 03/09/21 Potential to Achieve Goals: Fair  OT Frequency:      Co-evaluation PT/OT/SLP Co-Evaluation/Treatment: Yes Reason for Co-Treatment: Complexity of the patient's impairments (multi-system involvement);For patient/therapist safety   OT goals addressed during session: ADL's and self-care      AM-PAC OT "6 Clicks" Daily Activity     Outcome Measure Help from another person eating meals?: A Lot Help from another person taking care of personal grooming?: A Lot Help from another person toileting, which includes using toliet, bedpan, or urinal?: Total Help from another person bathing (including washing, rinsing, drying)?: Total Help from another person to put on and taking off regular upper body clothing?: A Lot Help from another person to put on and taking off regular lower body clothing?: Total 6 Click Score: 9   End of Session Nurse Communication: Mobility status  Activity Tolerance: Patient limited by pain Patient left: in bed;with call bell/phone within reach;with bed alarm set;with family/visitor present  OT Visit Diagnosis: Pain Pain - Right/Left: Right Pain - part of body: Leg                Time: 6503-5465 OT Time Calculation (min): 22 min Charges:  OT General Charges $OT Visit: 1 Visit OT Evaluation $OT Eval Moderate Complexity: 1 Mod  02/23/2021  RP, OTR/L  Acute Rehabilitation Services  Office:  [phone]   Suzanna Obey 02/23/2021, 10:44 AM

## 2021-02-23 NOTE — Anesthesia Postprocedure Evaluation (Signed)
Anesthesia Post Note  Patient: Emily Camacho  Procedure(s) Performed: INTRAMEDULLARY (IM) RETROGRADE FEMORAL NAILING (Bilateral)     Patient location during evaluation: PACU Anesthesia Type: General Level of consciousness: sedated Pain management: pain level controlled Vital Signs Assessment: post-procedure vital signs reviewed and stable Respiratory status: spontaneous breathing and respiratory function stable Cardiovascular status: stable Postop Assessment: no apparent nausea or vomiting Anesthetic complications: no   No notable events documented.               Elysse Polidore DANIEL

## 2021-02-23 NOTE — TOC CAGE-AID Note (Signed)
Transition of Care [facility]) - CAGE-AID Screening   Patient Details  Name: Emily Camacho MRN: [id number] Date of Birth: September 01, 1928  [facility]) CM/SW Contact:    Virda Betters C Tarpley-Carter, [place] Phone Number: 02/23/2021, 10:17 AM   Clinical Narrative: Pt is unable to participate in Cage Aid. Pt is not age appropriate for assessment.  Lenix Benoist Tarpley-Carter, MSW, LCSW-A Pronouns:  She/Her/Hers [place] Transitions of Care Clinical Social Worker Direct Number:  [phone] [email or web address]  CAGE-AID Screening: Substance Abuse Screening unable to be completed due to: : Patient unable to participate (Pt is not appropriate for assessment.)             Substance Abuse Education Offered: No

## 2021-02-23 NOTE — Progress Notes (Addendum)
Progress Note   Patient: Emily Camacho WER:[id number] DOB: 02/24/1928 DOA: 02/22/2021     1 DOS: the patient was seen and examined on 02/23/2021   Brief hospital course: Emily Camacho is a 88 y.o. female with medical history significant of hypertension, chronic combined diastolic and systolic CHF, hypothyroidism, dementia, and GERD who presents after having an unwitnessed fall at the nursing facility where she resides.  Work-up revealed bilateral femoral shaft fractures.  Post retrograde intramedullary nailing of the left femur and right femur on 02/22/2021 by Dr. Carola Frost.  Obstacles complicated by acute blood loss anemia with hemoglobin 5.9.  2 units PRBCs ordered to be transfused on 02/23/2021.   Assessment and Plan: No notes have been filed under this hospital service. Service: Hospitalist  Bilateral femoral shaft fractures secondary to unwitnessed fall, post repair on 02/22/2021 by orthopedic surgery Dr. Carola Frost:  Pain management and bowel regimen in place. Per daughter at bedside patient has been bedbound for the past 2 years.   Rest of management and DVT prophylaxis per orthopedic surgery.  Acute blood loss anemia postsurgery Drop in hemoglobin this morning 2 units PRBCs transfused for hemoglobin of 5.9. Continue to monitor H&H  Acute thrombocytopenia Drop in platelet count 90 from 313 yesterday Repeat stat CBC and obtain peripheral blood smear She is on subcu Lovenox   Resolved post repletion: Hypokalemia:  Serum potassium 4.1 from 3.1.  Replete electrolytes as indicated   Dementia:  Continue delirium precautions. Continue fall precautions.   Hyperglycemia:  Obtain hemoglobin A1c    Bedbound:  At baseline patient has been bedbound for the last 2 years noted by her daughter at bedside.  Essential hypertension: Soft BP, hold off antihypertensives.  Continue to closely monitor vital signs.    Hypothyroidism -Continue levothyroxine   Hyperlipidemia -Continue statin    Normocytic anemia:  Hemoglobin at baseline 11 with MCV 92.   Continue to monitor H&H    DVT prophylaxis: Lovenox Code Status: DNR Family Communication: Patient's son updated at bedside Disposition Plan: Likely will need discharge back to nursing facility Consults called: Orthopedics Admission status: Inpatient, require more than 2 midnight stay for need of procedure     Subjective: Patient was seen and examined with her daughter and son-in-law at bedside.  She is alert and pleasantly demented.  She denies having any pain.  Physical Exam: Vitals:   02/22/21 2315 02/22/21 2337 02/23/21 0307 02/23/21 0754  BP: (!) 155/60 (!) 146/66 (!) 177/72 (!) 72/57  Pulse: 87 75 86 70  Resp: (!) 22 17 15 17   Temp: (!) 97.3 F (36.3 C) 97.9 F (36.6 C) 99.1 F (37.3 C) 98.9 F (37.2 C)  TempSrc:  Oral Oral Oral  SpO2: 92% 100% 100% 98%  Weight:      Height:       General: Frail-appearing no acute distress.  She is alert and pleasantly demented. Cardiovascular: Regular rate and rhythm no rubs or gallops.  No JVD noted. Respiratory: Clear to auscultation no wheezes or rales.  Paroxysmal A. fib. Abdomen: Nondistended.  Nontender.  Bowel sounds present. Musculoskeletal: Bilateral lower extremity trace edema. Psych: Mood is appropriate for condition and setting.   Data Reviewed:  Family Communication: Updated her daughter and son-in-law at bedside.  Disposition: Status is: Inpatient  Patient requires at least 2 midnights for further evaluation and treatment of present condition.       Critical care time: 65 minutes.  Author: , DO 02/23/2021 9:19 AM  For on call  review [email or web address].

## 2021-02-24 DIAGNOSIS — S72341A Displaced spiral fracture of shaft of right femur, initial encounter for closed fracture: Secondary | ICD-10-CM | POA: Diagnosis not present

## 2021-02-24 DIAGNOSIS — Z7401 Bed confinement status: Secondary | ICD-10-CM | POA: Diagnosis not present

## 2021-02-24 LAB — BASIC METABOLIC PANEL WITH GFR
Anion gap: 7 (ref 5–15)
BUN: 36 mg/dL — ABNORMAL HIGH (ref 8–23)
CO2: 23 mmol/L (ref 22–32)
Calcium: 7.4 mg/dL — ABNORMAL LOW (ref 8.9–10.3)
Chloride: 105 mmol/L (ref 98–111)
Creatinine, Ser: 1.72 mg/dL — ABNORMAL HIGH (ref 0.44–1.00)
GFR, Estimated: 28 mL/min — ABNORMAL LOW
Glucose, Bld: 119 mg/dL — ABNORMAL HIGH (ref 70–99)
Potassium: 4.3 mmol/L (ref 3.5–5.1)
Sodium: 135 mmol/L (ref 135–145)

## 2021-02-24 LAB — CBC
HCT: 21.6 % — ABNORMAL LOW (ref 36.0–46.0)
Hemoglobin: 7.6 g/dL — ABNORMAL LOW (ref 12.0–15.0)
MCH: 31.1 pg (ref 26.0–34.0)
MCHC: 35.2 g/dL (ref 30.0–36.0)
MCV: 88.5 fL (ref 80.0–100.0)
Platelets: 85 K/uL — ABNORMAL LOW (ref 150–400)
RBC: 2.44 MIL/uL — ABNORMAL LOW (ref 3.87–5.11)
RDW: 15.2 % (ref 11.5–15.5)
WBC: 6.8 K/uL (ref 4.0–10.5)
nRBC: 0.3 % — ABNORMAL HIGH (ref 0.0–0.2)

## 2021-02-24 LAB — HEMOGLOBIN AND HEMATOCRIT, BLOOD
HCT: 26.3 % — ABNORMAL LOW (ref 36.0–46.0)
Hemoglobin: 9 g/dL — ABNORMAL LOW (ref 12.0–15.0)

## 2021-02-24 LAB — PREPARE RBC (CROSSMATCH)

## 2021-02-24 MED ORDER — SODIUM CHLORIDE 0.9% IV SOLUTION: Freq: Once | INTRAVENOUS | Status: AC

## 2021-02-24 NOTE — Progress Notes (Signed)
PROGRESS NOTE  Emily Camacho QZR:[id number] DOB: November 20, 1928 DOA: 02/22/2021 PCP: Koren Shiver, DO   LOS: 2 days   Brief Narrative / Interim history: Emily Camacho is a 88 y.o. female with medical history significant of hypertension, chronic combined diastolic and systolic CHF, hypothyroidism, dementia, and GERD who presents after having an unwitnessed fall at the nursing facility where she resides.  Work-up revealed bilateral femoral shaft fractures.  Post retrograde intramedullary nailing of the left femur and right femur on 02/22/2021 by Dr. Carola Frost.  Subjective / 24h Interval events: -Pleasant, confused.  No complaints.  Assessment & Plan: Principal problem Bilateral femoral shaft fractures secondary to unwitnessed fall-patient was admitted to the hospital with by lateral femoral shaft fractures due to an unwitnessed fall.  Orthopedic surgery consulted, she was taken to the OR on 1/31 status postrepair by Dr. Carola Frost.  Continue to monitor postoperatively, pain control and will likely need to go back to SNF.  Patient apparently has been bound for the past 3 years.  DVT prophylaxis per orthopedic surgery  Active problems Acute blood loss anemia postsurgery -following surgery patient had a drop of hemoglobin to 5.9.  She was transfused unit of packed red blood cells, hemoglobin improved this morning to 7.6.  Transfuse an additional unit   Acute thrombocytopenia -likely consumptive, postoperatively.  Closely monitor, platelets 85  Hypokalemia-resolved with repletion  Dementia-continue delirium precautions  Essential hypertension -at home she is on lisinopril, metoprolol but here she is normotensive.  Continue to hold   Hypothyroidism -Continue levothyroxine   Hyperlipidemia-Continue statin    Scheduled Meds:  acetaminophen  650 mg Oral Q8H   acetaminophen  650 mg Oral Once   docusate sodium  100 mg Oral BID   feeding supplement  237 mL Oral TID   furosemide  20 mg Intravenous  Once   levothyroxine  25 mcg Oral Daily   oxybutynin  5 mg Oral Daily   rosuvastatin  5 mg Oral QHS   zinc oxide  1 application Topical BID   Continuous Infusions:  sodium chloride     PRN Meds:.HYDROcodone-acetaminophen, menthol-cetylpyridinium **OR** phenol, metoCLOPramide **OR** metoCLOPramide (REGLAN) injection, morphine injection, ondansetron **OR** ondansetron (ZOFRAN) IV, zinc oxide  Diet Orders (From admission, onward)     Start     Ordered   02/23/21 0919  DIET DYS 3 Room service appropriate? Yes with Assist; Fluid consistency: Thin  Diet effective now       Question Answer Comment  Room service appropriate? Yes with Assist   Fluid consistency: Thin      02/23/21 0918            DVT prophylaxis: Place TED hose Start: 02/23/21 1008   Lab Results  Component Value Date   PLT 85 (L) 02/24/2021      Code Status: DNR  Family Communication: No family at bedside  Status is: Inpatient  Remains inpatient appropriate because: Ongoing transfusion,  Level of care: Progressive  Consultants:  Orthopedic surgery    Objective: Vitals:   02/24/21 0804 02/24/21 1056 02/24/21 1109 02/24/21 1130  BP:  (!) 117/39  (!) 119/36  Pulse:  70  63  Resp:   19   Temp: 98.8 F (37.1 C) 97.8 F (36.6 C)  98.8 F (37.1 C)  TempSrc: Oral Oral  Oral  SpO2:      Weight:      Height:        Intake/Output Summary (Last 24 hours) at 02/24/2021 1405 Last data filed  at 02/24/2021 0325 Gross per 24 hour  Intake 615 ml  Output 125 ml  Net 490 ml   Wt Readings from Last 3 Encounters:  02/22/21 54.4 kg  02/28/20 54.4 kg  01/05/19 51.2 kg    Examination:  Constitutional: NAD Eyes: no scleral icterus ENMT: Mucous membranes are moist.  Neck: normal, supple Respiratory: clear to auscultation bilaterally, no wheezing, no crackles.  Cardiovascular: Regular rate and rhythm, no murmurs / rubs / gallops. No LE edema.  Abdomen: non distended, no tenderness. Bowel sounds positive.   Musculoskeletal: no clubbing / cyanosis.  Skin: no rashes Neurologic: non focal    Data Reviewed: I have independently reviewed following labs and imaging studies   CBC Recent Labs  Lab 02/22/21 0613 02/22/21 1946 02/23/21 0629 02/23/21 0941 02/23/21 2143 02/24/21 0202  WBC 10.1  --  5.5 CANCEL PER KEITH PAUL,PA/GINA,RN/[id number]  --  6.8  HGB 11.0* 5.9* 7.3* CANCEL PER KEITH PAUL,PA/GINA,RN/[id number] 7.6* 7.6*  HCT 33.7* 18.9* 20.9* CANCEL PER KEITH PAUL,PA/GINA,RN/[id number] 22.5* 21.6*  PLT 313  --  90* CANCEL PER KEITH PAUL,PA/GINA,RN/[id number]  --  85*  MCV 92.6  --  87.8 CANCEL PER KEITH PAUL,PA/GINA,RN/[id number]  --  88.5  MCH 30.2  --  30.7 CANCEL PER KEITH PAUL,PA/GINA,RN/[id number]  --  31.1  MCHC 32.6  --  34.9 CANCEL PER KEITH PAUL,PA/GINA,RN/[id number]  --  35.2  RDW 13.8  --  14.7 CANCEL PER KEITH PAUL,PA/GINA,RN/TV1141  --  15.2  LYMPHSABS 1.6  --   --  PENDING  --   --   MONOABS 0.5  --   --  PENDING  --   --   EOSABS 0.2  --   --  PENDING  --   --   BASOSABS 0.0  --   --  PENDING  --   --     Recent Labs  Lab 02/22/21 0613 02/23/21 0629 02/24/21 0202  NA 137 139 135  K 3.1* 4.1 4.3  CL 103 106 105  CO2 22 25 23   GLUCOSE 202* 142* 119*  BUN 22 23 36*  CREATININE 0.92 1.09* 1.72*  CALCIUM 8.8* 7.8* 7.4*    ------------------------------------------------------------------------------------------------------------------ No results for input(s): CHOL, HDL, LDLCALC, TRIG, CHOLHDL, LDLDIRECT in the last 72 hours.  No results found for: HGBA1C ------------------------------------------------------------------------------------------------------------------ No results for input(s): TSH, T4TOTAL, T3FREE, THYROIDAB in the last 72 hours.  Invalid input(s): FREET3  Cardiac Enzymes No results for input(s): CKMB, TROPONINI, MYOGLOBIN in the last 168 hours.  Invalid input(s):  CK ------------------------------------------------------------------------------------------------------------------ No results found for: BNP  CBG: No results for input(s): GLUCAP in the last 168 hours.  Recent Results (from the past 240 hour(s))  Resp Panel by RT-PCR (Flu A&B, Covid) Nasopharyngeal Swab     Status: None   Collection Time: 02/22/21  6:14 AM   Specimen: Nasopharyngeal Swab; Nasopharyngeal(NP) swabs in vial transport medium  Result Value Ref Range Status   SARS Coronavirus 2 by RT PCR NEGATIVE NEGATIVE Final    Comment: (NOTE) SARS-CoV-2 target nucleic acids are NOT DETECTED.  The SARS-CoV-2 RNA is generally detectable in upper respiratory specimens during the acute phase of infection. The lowest concentration of SARS-CoV-2 viral copies this assay can detect is 138 copies/mL. A negative result does not preclude SARS-Cov-2 infection and should not be used as the sole basis for treatment or other patient management decisions. A negative result may occur with  improper specimen collection/handling, submission of specimen other than nasopharyngeal swab, presence of viral mutation(s) within the areas  targeted by this assay, and inadequate number of viral copies(<138 copies/mL). A negative result must be combined with clinical observations, patient history, and epidemiological information. The expected result is Negative.  Fact Sheet for Patients:  BloggerCourse.com  Fact Sheet for Healthcare Providers:  SeriousBroker.it  This test is no t yet approved or cleared by the [place] FDA and  has been authorized for detection and/or diagnosis of SARS-CoV-2 by FDA under an Emergency Use Authorization (EUA). This EUA will remain  in effect (meaning this test can be used) for the duration of the COVID-19 declaration under Section 564(b)(1) of the Act, 21 U.S.C.section 360bbb-3(b)(1), unless the authorization is  terminated  or revoked sooner.       Influenza A by PCR NEGATIVE NEGATIVE Final   Influenza B by PCR NEGATIVE NEGATIVE Final    Comment: (NOTE) The Xpert Xpress SARS-CoV-2/FLU/RSV plus assay is intended as an aid in the diagnosis of influenza from Nasopharyngeal swab specimens and should not be used as a sole basis for treatment. Nasal washings and aspirates are unacceptable for Xpert Xpress SARS-CoV-2/FLU/RSV testing.  Fact Sheet for Patients: BloggerCourse.com  Fact Sheet for Healthcare Providers: SeriousBroker.it  This test is not yet approved or cleared by the [place] FDA and has been authorized for detection and/or diagnosis of SARS-CoV-2 by FDA under an Emergency Use Authorization (EUA). This EUA will remain in effect (meaning this test can be used) for the duration of the COVID-19 declaration under Section 564(b)(1) of the Act, 21 U.S.C. section 360bbb-3(b)(1), unless the authorization is terminated or revoked.  Performed at [facility], [address] [place], [place] [address]      Radiology Studies: No results found.   Pamella Pert, MD, PhD [facility]  Between 7 am - 7 pm I am available, please contact me via Amion (for emergencies) or Securechat (non urgent messages)  Between 7 pm - 7 am I am not available, please contact night coverage MD/APP via Amion

## 2021-02-24 NOTE — Hospital Course (Signed)
Emily Camacho is a 88 y.o. female with medical history significant of hypertension, chronic combined diastolic and systolic CHF, hypothyroidism, dementia, and GERD who presents after having an unwitnessed fall at the nursing facility where she resides.  Work-up revealed bilateral femoral shaft fractures.  Post retrograde intramedullary nailing of the left femur and right femur on 02/22/2021 by Dr. Carola Frost.

## 2021-02-24 NOTE — Progress Notes (Signed)
Speech Language Pathology Treatment:    Patient Details Name: Emily Camacho MRN: [id number] DOB: May 29, 1928 Today's Date: 02/24/2021 Time: 2683-4196 SLP Time Calculation (min) (ACUTE ONLY): 20 min  Assessment / Plan / Recommendation Clinical Impression  Pt was seen at bedside for dysphagia treatment. She was alert and pleasant throughout the session. Pt presented with dys 3 fruit cup (pears) and thin liquids. Pt tolerated all textures safely and consumed ~4 oz fruit and ~0.5 oz of thin liquids independently. No overt s/sx of aspiration, including throat clearing, or coughing noted throughout the session. Pt verbalized tolerating dys 3 diet and thin liquids; reportedly consumed macaroni noodles, salad, and apple pie with no reported difficulties or complaints. Oral preparatory phase of swallowing WFL. SLP recommends keeping pt on dys 3 and thin liquids d/t inadequate dentition/absent dentures at discharge. Communicated with nursing staff and agrees that diet and recommendations appear appropriate for pt at this time. Further SLP services are not clinically needed at this time as pt is tolerating diet adequately. SLP to sign off.   HPI HPI: Pt is a 88 y.o. female who presented after having an unwitnessed fall at the SNF. Patient found to have bilateral femur fxs and orthopedic surgery consulted and pt s/p IMN of Lef and right femur 1/31. Pt was intubated for procedure and a iet odered thereafter. XCXR onadmission negative PMH: hypertension, CHF, hypothyroidism, dementia, and GERD. Pt last seen by SLP in December, 2020 and a dysphagia 3 diet with thin liquids was recommended at that time.      SLP Plan  All goals met      Recommendations for follow up therapy are one component of a multi-disciplinary discharge planning process, led by the attending physician.  Recommendations may be updated based on patient status, additional functional criteria and insurance authorization.    Recommendations  Diet  recommendations: Dysphagia 3 (mechanical soft);Thin liquid Liquids provided via: Straw;Cup Medication Administration: Whole meds with liquid Supervision: Patient able to self feed Compensations: Minimize environmental distractions Postural Changes and/or Swallow Maneuvers: Out of bed for meals                Oral Care Recommendations: Oral care BID Follow Up Recommendations: No SLP follow up Assistance recommended at discharge: PRN SLP Visit Diagnosis: Dysphagia, unspecified (R13.10) Plan: All goals met           Vaughan Sine  02/24/2021, 10:35 AM

## 2021-02-24 NOTE — TOC Progression Note (Signed)
Transition of Care [facility]) - Progression Note    Patient Details  Name: ABBIEGALE ALARID MRN: [id number] Date of Birth: Oct 09, 1928  Transition of Care [facility]) CM/SW [place], [place] Phone Number: 02/24/2021, 9:44 AM  Clinical Narrative:     Received phone call from family- requested to send referral to [place] to review- Contacted SNF- left voice message referral was sent    Expected Discharge Plan: [place] Barriers to Discharge: Continued Medical Work up  Expected Discharge Plan and Services Expected Discharge Plan: [place] In-house Referral: Clinical Social Work     Living arrangements for the past 2 months: [place]                                       Social Determinants of Health (SDOH) Interventions    Readmission Risk Interventions No flowsheet data found.

## 2021-02-24 NOTE — Progress Notes (Signed)
Orthopaedic Trauma Service Progress Note  Patient ID: Emily Camacho MRN: [id number] DOB/AGE: 86-Oct-1930 86 y.o.  Subjective:  Only got 1 unit of blood yesterday  Getting 2nd unit today   Sons at bedside  Discussed getting palliative care consult to establish goals of care  Pt essentially bed bound for 2 years prior to admission. We discussed that such a significant injury such as bilateral femur fractures in a 88 year old could lead to rapid decline.  They are in agreement   Plan is to dc to [facility] once medically stably   ROS Sleeping   Objective:   VITALS:   Vitals:   02/24/21 0804 02/24/21 1056 02/24/21 1109 02/24/21 1130  BP:  (!) 117/39  (!) 119/36  Pulse:  70  63  Resp:   19   Temp: 98.8 F (37.1 C) 97.8 F (36.6 C)  98.8 F (37.1 C)  TempSrc: Oral Oral  Oral  SpO2:      Weight:      Height:        Estimated body mass index is 25.95 kg/m as calculated from the following:   Height as of this encounter: 4\' 9"  (1.448 m).   Weight as of this encounter: 54.4 kg.   Intake/Output      02/01 0701 02/02 0700 02/02 0701 02/03 0700   P.O. 300    I.V. (mL/kg)     Blood 315    IV Piggyback     Total Intake(mL/kg) 615 (11.3)    Urine (mL/kg/hr) 525 (0.4)    Blood     Total Output 525    Net +90           LABS  Results for orders placed or performed during the hospital encounter of 02/22/21 (from the past 24 hour(s))  Hemoglobin and hematocrit, blood     Status: Abnormal   Collection Time: 02/23/21  9:43 PM  Result Value Ref Range   Hemoglobin 7.6 (L) 12.0 - 15.0 g/dL   HCT 22.5 (L) 36.0 - 46.0 %  CBC     Status: Abnormal   Collection Time: 02/24/21  2:02 AM  Result Value Ref Range   WBC 6.8 4.0 - 10.5 K/uL   RBC 2.44 (L) 3.87 - 5.11 MIL/uL   Hemoglobin 7.6 (L) 12.0 - 15.0 g/dL   HCT 21.6 (L) 36.0 - 46.0 %   MCV 88.5 80.0 - 100.0 fL   MCH 31.1 26.0 - 34.0 pg   MCHC  35.2 30.0 - 36.0 g/dL   RDW 15.2 11.5 - 15.5 %   Platelets 85 (L) 150 - 400 K/uL   nRBC 0.3 (H) 0.0 - 0.2 %  Basic metabolic panel     Status: Abnormal   Collection Time: 02/24/21  2:02 AM  Result Value Ref Range   Sodium 135 135 - 145 mmol/L   Potassium 4.3 3.5 - 5.1 mmol/L   Chloride 105 98 - 111 mmol/L   CO2 23 22 - 32 mmol/L   Glucose, Bld 119 (H) 70 - 99 mg/dL   BUN 36 (H) 8 - 23 mg/dL   Creatinine, Ser 1.72 (H) 0.44 - 1.00 mg/dL   Calcium 7.4 (L) 8.9 - 10.3 mg/dL   GFR, Estimated 28 (L) >60 mL/min   Anion gap 7 5 - 15  Prepare RBC (crossmatch)     Status: None   Collection Time: 02/24/21  8:00 AM  Result Value Ref Range   Order Confirmation      ORDER PROCESSED BY BLOOD BANK BB SAMPLE OR UNITS ALREADY AVAILABLE Performed at [facility], [place] [address], [place], [place] [address]      PHYSICAL EXAM:   Gen: pt is sleeping  I did not disturb her for exam of her extremities  Lungs: unlabored Cardiac: reg   Assessment/Plan: 2 Days Post-Op     Anti-infectives (From admission, onward)    Start     Dose/Rate Route Frequency Ordered Stop   02/23/21 0000  clindamycin (CLEOCIN) IVPB 600 mg        600 mg 100 mL/hr over 30 Minutes Intravenous Every 6 hours 02/22/21 2331 02/23/21 1159   02/22/21 1400  vancomycin (VANCOCIN) IVPB 1000 mg/200 mL premix        1,000 mg 200 mL/hr over 60 Minutes Intravenous On call to O.R. 02/22/21 1342 02/22/21 1521     .  POD/HD#: 2  88 y/o female s/p fall with B femur fractures    -B femur fractures, non-ambulator s/p B retrograde IMN             NWB B LEX             Motion as tolerated                          Pt has severe arthritis B hips                          Contracture R knee joint---> knee moves to no more than 50 degrees                                      Therapy evals             Dressing changes as needed                Palliative care consult to establish goals                Compressing socks for  swelling    - Pain management:             Multimodal             Minimize narcotics   - ABL anemia/Hemodynamics             2 unit of blood being transfused this am    - Medical issues              Thrombocytopenia                               Likely consumptive/hemodilution from surgery                          continue to hold lovenox    - DVT/PE prophylaxis:             Holding lovenox              TED hose - ID:              Periop abx   - Metabolic Bone Disease:  B femur fragility fractures = osteoporosis              Check vitamin d levels             Has been non-ambulatory for 2 years so bone quality is extremely poor    - Activity:             Bed to chair with assist                 - Impediments to fracture healing:             Poor bone quality/osteoporosis               - Dispo:             Ortho issues stable             2nd unit of blood being transfused today  Palliative care consult              [place], PA-C [phone] (C) 02/24/2021, 12:52 PM  [facility] [place] [address] [phone] Jenetta [phone] (F)    After 5pm and on the weekends please log on to Amion, go to orthopaedics and the look under the [facility] Call for the provider(s) on call. You can also call our office at [phone] and then follow the prompts to be connected to the call team.   Patient ID: Emily Camacho, female   DOB: Oct 14, 1928, 88 y.o.   MRN: [id number]

## 2021-02-25 DIAGNOSIS — S72341A Displaced spiral fracture of shaft of right femur, initial encounter for closed fracture: Secondary | ICD-10-CM | POA: Diagnosis not present

## 2021-02-25 DIAGNOSIS — Z7401 Bed confinement status: Secondary | ICD-10-CM | POA: Diagnosis not present

## 2021-02-25 LAB — BPAM RBC
Blood Product Expiration Date: 202302232359
Blood Product Expiration Date: 202302242359
Blood Product Expiration Date: 202302262359
Blood Product Expiration Date: 202302272359
ISSUE DATE / TIME: 202301312053
ISSUE DATE / TIME: 202301312053
ISSUE DATE / TIME: 202302011610
ISSUE DATE / TIME: 202302021034
Unit Type and Rh: 6200
Unit Type and Rh: 6200
Unit Type and Rh: 6200
Unit Type and Rh: 6200

## 2021-02-25 LAB — CBC
HCT: 24.9 % — ABNORMAL LOW (ref 36.0–46.0)
Hemoglobin: 8.6 g/dL — ABNORMAL LOW (ref 12.0–15.0)
MCH: 31.5 pg (ref 26.0–34.0)
MCHC: 34.5 g/dL (ref 30.0–36.0)
MCV: 91.2 fL (ref 80.0–100.0)
Platelets: 93 10*3/uL — ABNORMAL LOW (ref 150–400)
RBC: 2.73 MIL/uL — ABNORMAL LOW (ref 3.87–5.11)
RDW: 15.3 % (ref 11.5–15.5)
WBC: 6.6 10*3/uL (ref 4.0–10.5)
nRBC: 0 % (ref 0.0–0.2)

## 2021-02-25 LAB — HEMOGLOBIN A1C
Hgb A1c MFr Bld: 5.7 % — ABNORMAL HIGH (ref 4.8–5.6)
Mean Plasma Glucose: 117 mg/dL

## 2021-02-25 LAB — TYPE AND SCREEN
ABO/RH(D): A POS
Antibody Screen: NEGATIVE
Unit division: 0
Unit division: 0
Unit division: 0
Unit division: 0

## 2021-02-25 LAB — COMPREHENSIVE METABOLIC PANEL
ALT: 13 U/L (ref 0–44)
AST: 18 U/L (ref 15–41)
Albumin: 2.5 g/dL — ABNORMAL LOW (ref 3.5–5.0)
Alkaline Phosphatase: 51 U/L (ref 38–126)
Anion gap: 6 (ref 5–15)
BUN: 35 mg/dL — ABNORMAL HIGH (ref 8–23)
CO2: 24 mmol/L (ref 22–32)
Calcium: 7.6 mg/dL — ABNORMAL LOW (ref 8.9–10.3)
Chloride: 107 mmol/L (ref 98–111)
Creatinine, Ser: 1.16 mg/dL — ABNORMAL HIGH (ref 0.44–1.00)
GFR, Estimated: 45 mL/min — ABNORMAL LOW (ref >60)
Glucose, Bld: 110 mg/dL — ABNORMAL HIGH (ref 70–99)
Potassium: 4.4 mmol/L (ref 3.5–5.1)
Sodium: 137 mmol/L (ref 135–145)
Total Bilirubin: 0.3 mg/dL (ref 0.3–1.2)
Total Protein: 4.8 g/dL — ABNORMAL LOW (ref 6.5–8.1)

## 2021-02-25 NOTE — Progress Notes (Signed)
PROGRESS NOTE  Emily Camacho PYK:[id number] DOB: Aug 22, 1928 DOA: 02/22/2021 PCP: Koren Shiver, DO   LOS: 3 days   Brief Narrative / Interim history: Emily Camacho is a 88 y.o. female with medical history significant of hypertension, chronic combined diastolic and systolic CHF, hypothyroidism, dementia, and GERD who presents after having an unwitnessed fall at the nursing facility where she resides.  Work-up revealed bilateral femoral shaft fractures.  Post retrograde intramedullary nailing of the left femur and right femur on 02/22/2021 by Dr. Carola Frost.  Subjective / 24h Interval events: -Doing well this morning.  Complains of leg pain, has underlying dementia and does not know why.  Assessment & Plan: Principal problem Bilateral femoral shaft fractures secondary to unwitnessed fall-patient was admitted to the hospital with by lateral femoral shaft fractures due to an unwitnessed fall.  Orthopedic surgery consulted, she was taken to the OR on 1/31 status postrepair by Dr. Carola Frost.  Continue to monitor postoperatively, pain control and will likely need to go back to SNF.  Patient apparently has been bound for the past 3 years.  DVT prophylaxis per orthopedic surgery  Active problems Acute blood loss anemia postsurgery -following surgery patient had a drop of hemoglobin to 5.9.  She was transfused a total of 3 unit of packed red blood cells over the last 2 days.  Hemoglobin appears to have stabilized, continue to monitor  Acute kidney injury on chronic kidney disease stage IIIa-Baseline creatinine around 1.2, in the setting of postop anemia went up to 1.7 but improved and is back to baseline this morning at 1.1  Acute thrombocytopenia -likely consumptive, postoperatively.  Closely monitor, platelets better today  Hypokalemia-resolved with repletion  Dementia-continue delirium precautions  Essential hypertension -at home she is on lisinopril, metoprolol but here she is normotensive.   Continue to hold home antihypertensives   Hypothyroidism -continue levothyroxine   Hyperlipidemia-on statin    Scheduled Meds:  acetaminophen  650 mg Oral Q8H   acetaminophen  650 mg Oral Once   docusate sodium  100 mg Oral BID   feeding supplement  237 mL Oral TID   furosemide  20 mg Intravenous Once   levothyroxine  25 mcg Oral Daily   oxybutynin  5 mg Oral Daily   rosuvastatin  5 mg Oral QHS   zinc oxide  1 application Topical BID   Continuous Infusions:  sodium chloride     PRN Meds:.HYDROcodone-acetaminophen, menthol-cetylpyridinium **OR** phenol, metoCLOPramide **OR** metoCLOPramide (REGLAN) injection, morphine injection, ondansetron **OR** ondansetron (ZOFRAN) IV, zinc oxide  Diet Orders (From admission, onward)     Start     Ordered   02/23/21 0919  DIET DYS 3 Room service appropriate? Yes with Assist; Fluid consistency: Thin  Diet effective now       Question Answer Comment  Room service appropriate? Yes with Assist   Fluid consistency: Thin      02/23/21 0918            DVT prophylaxis: Place TED hose Start: 02/23/21 1008   Lab Results  Component Value Date   PLT 93 (L) 02/25/2021      Code Status: DNR  Family Communication: No family at bedside  Status is: Inpatient  Remains inpatient appropriate because: Ongoing transfusion,  Level of care: Progressive  Consultants:  Orthopedic surgery    Objective: Vitals:   02/24/21 1929 02/24/21 2318 02/25/21 0335 02/25/21 0732  BP: (!) 122/55 (!) 119/52 (!) 135/50 137/75  Pulse: 88 94 74 93  Resp: 19  18 15 16   Temp: 99 F (37.2 C) 98.9 F (37.2 C) 99.2 F (37.3 C) 98.5 F (36.9 C)  TempSrc: Oral Oral Axillary Oral  SpO2: 98% 97% 98% 95%  Weight:      Height:        Intake/Output Summary (Last 24 hours) at 02/25/2021 1125 Last data filed at 02/25/2021 0336 Gross per 24 hour  Intake 536 ml  Output 900 ml  Net -364 ml    Wt Readings from Last 3 Encounters:  02/22/21 54.4 kg  02/28/20  54.4 kg  01/05/19 51.2 kg    Examination:  Constitutional: NAD Eyes: lids and conjunctivae normal, no scleral icterus ENMT: mmm Neck: normal, supple Respiratory: clear to auscultation bilaterally, no wheezing, no crackles. Normal respiratory effort.  Cardiovascular: Regular rate and rhythm, no murmurs / rubs / gallops. No LE edema. Abdomen: soft, no distention, no tenderness. Bowel sounds positive.  Skin: no rashes Neurologic: no focal deficits, equal strength   Data Reviewed: I have independently reviewed following labs and imaging studies   CBC Recent Labs  Lab 02/22/21 0613 02/22/21 1946 02/23/21 0629 02/23/21 0941 02/23/21 2143 02/24/21 0202 02/24/21 1917 02/25/21 0344  WBC 10.1  --  5.5 CANCEL PER KEITH PAUL,PA/GINA,RN/[id number]  --  6.8  --  6.6  HGB 11.0*   < > 7.3* CANCEL PER KEITH PAUL,PA/GINA,RN/[id number] 7.6* 7.6* 9.0* 8.6*  HCT 33.7*   < > 20.9* CANCEL PER KEITH PAUL,PA/GINA,RN/[id number] 22.5* 21.6* 26.3* 24.9*  PLT 313  --  90* CANCEL PER KEITH PAUL,PA/GINA,RN/TV1141  --  85*  --  93*  MCV 92.6  --  87.8 CANCEL PER KEITH PAUL,PA/GINA,RN/[id number]  --  88.5  --  91.2  MCH 30.2  --  30.7 CANCEL PER KEITH PAUL,PA/GINA,RN/TV1141  --  31.1  --  31.5  MCHC 32.6  --  34.9 CANCEL PER KEITH PAUL,PA/GINA,RN/TV1141  --  35.2  --  34.5  RDW 13.8  --  14.7 CANCEL PER KEITH PAUL,PA/GINA,RN/TV1141  --  15.2  --  15.3  LYMPHSABS 1.6  --   --  PENDING  --   --   --   --   MONOABS 0.5  --   --  PENDING  --   --   --   --   EOSABS 0.2  --   --  PENDING  --   --   --   --   BASOSABS 0.0  --   --  PENDING  --   --   --   --    < > = values in this interval not displayed.     Recent Labs  Lab 02/22/21 0613 02/23/21 0629 02/24/21 0202 02/25/21 0344  NA 137 139 135 137  K 3.1* 4.1 4.3 4.4  CL 103 106 105 107  CO2 22 25 23 24   GLUCOSE 202* 142* 119* 110*  BUN 22 23 36* 35*  CREATININE 0.92 1.09* 1.72* 1.16*  CALCIUM 8.8* 7.8* 7.4* 7.6*  AST  --   --   --  18  ALT  --   --   --   13  ALKPHOS  --   --   --  51  BILITOT  --   --   --  0.3  ALBUMIN  --   --   --  2.5*  HGBA1C  --   --  5.7*  --      ------------------------------------------------------------------------------------------------------------------ No results for input(s): CHOL, HDL, LDLCALC, TRIG, CHOLHDL, LDLDIRECT in  the last 72 hours.  Lab Results  Component Value Date   HGBA1C 5.7 (H) 02/24/2021   ------------------------------------------------------------------------------------------------------------------ No results for input(s): TSH, T4TOTAL, T3FREE, THYROIDAB in the last 72 hours.  Invalid input(s): FREET3  Cardiac Enzymes No results for input(s): CKMB, TROPONINI, MYOGLOBIN in the last 168 hours.  Invalid input(s): CK ------------------------------------------------------------------------------------------------------------------ No results found for: BNP  CBG: No results for input(s): GLUCAP in the last 168 hours.  Recent Results (from the past 240 hour(s))  Resp Panel by RT-PCR (Flu A&B, Covid) Nasopharyngeal Swab     Status: None   Collection Time: 02/22/21  6:14 AM   Specimen: Nasopharyngeal Swab; Nasopharyngeal(NP) swabs in vial transport medium  Result Value Ref Range Status   SARS Coronavirus 2 by RT PCR NEGATIVE NEGATIVE Final    Comment: (NOTE) SARS-CoV-2 target nucleic acids are NOT DETECTED.  The SARS-CoV-2 RNA is generally detectable in upper respiratory specimens during the acute phase of infection. The lowest concentration of SARS-CoV-2 viral copies this assay can detect is 138 copies/mL. A negative result does not preclude SARS-Cov-2 infection and should not be used as the sole basis for treatment or other patient management decisions. A negative result may occur with  improper specimen collection/handling, submission of specimen other than nasopharyngeal swab, presence of viral mutation(s) within the areas targeted by this assay, and inadequate number of  viral copies(<138 copies/mL). A negative result must be combined with clinical observations, patient history, and epidemiological information. The expected result is Negative.  Fact Sheet for Patients:  BloggerCourse.com  Fact Sheet for Healthcare Providers:  SeriousBroker.it  This test is no t yet approved or cleared by the [place] FDA and  has been authorized for detection and/or diagnosis of SARS-CoV-2 by FDA under an Emergency Use Authorization (EUA). This EUA will remain  in effect (meaning this test can be used) for the duration of the COVID-19 declaration under Section 564(b)(1) of the Act, 21 U.S.C.section 360bbb-3(b)(1), unless the authorization is terminated  or revoked sooner.       Influenza A by PCR NEGATIVE NEGATIVE Final   Influenza B by PCR NEGATIVE NEGATIVE Final    Comment: (NOTE) The Xpert Xpress SARS-CoV-2/FLU/RSV plus assay is intended as an aid in the diagnosis of influenza from Nasopharyngeal swab specimens and should not be used as a sole basis for treatment. Nasal washings and aspirates are unacceptable for Xpert Xpress SARS-CoV-2/FLU/RSV testing.  Fact Sheet for Patients: BloggerCourse.com  Fact Sheet for Healthcare Providers: SeriousBroker.it  This test is not yet approved or cleared by the [place] FDA and has been authorized for detection and/or diagnosis of SARS-CoV-2 by FDA under an Emergency Use Authorization (EUA). This EUA will remain in effect (meaning this test can be used) for the duration of the COVID-19 declaration under Section 564(b)(1) of the Act, 21 U.S.C. section 360bbb-3(b)(1), unless the authorization is terminated or revoked.  Performed at [facility], [address], [place], [place] [address]       Radiology Studies: No results found.   Pamella Pert, MD, PhD [facility]  Between 7 am - 7  pm I am available, please contact me via Amion (for emergencies) or Securechat (non urgent messages)  Between 7 pm - 7 am I am not available, please contact night coverage MD/APP via Amion

## 2021-02-25 NOTE — Care Management Important Message (Signed)
Important Message  Patient Details  Name: Emily Camacho MRN: [id number] Date of Birth: April 02, 1928   Medicare Important Message Given:  Yes     Sherilyn Banker 02/25/2021, 1:06 PM

## 2021-02-25 NOTE — TOC Progression Note (Signed)
Transition of Care [facility]) - Progression Note    Patient Details  Name: Emily Camacho MRN: [id number] Date of Birth: [birth year]  Transition of Care [facility]) CM/SW Contact  Eduard Roux, LCSW Phone Number: 02/25/2021, 10:44 AM  Clinical Narrative:     Received call from [facility] can admit on Monday  if medically stable.  Antony Blackbird, MSW, LCSW Clinical Social Worker    Expected Discharge Plan: Skilled Nursing Facility Barriers to Discharge: Continued Medical Work up  Expected Discharge Plan and Services Expected Discharge Plan: Skilled Nursing Facility In-house Referral: Clinical Social Work     Living arrangements for the past 2 months: Skilled Nursing Facility                                       Social Determinants of Health (SDOH) Interventions    Readmission Risk Interventions No flowsheet data found.

## 2021-02-26 DIAGNOSIS — I10 Essential (primary) hypertension: Secondary | ICD-10-CM | POA: Diagnosis not present

## 2021-02-26 DIAGNOSIS — S72341A Displaced spiral fracture of shaft of right femur, initial encounter for closed fracture: Secondary | ICD-10-CM | POA: Diagnosis not present

## 2021-02-26 DIAGNOSIS — S72341D Displaced spiral fracture of shaft of right femur, subsequent encounter for closed fracture with routine healing: Secondary | ICD-10-CM

## 2021-02-26 DIAGNOSIS — Z7189 Other specified counseling: Secondary | ICD-10-CM | POA: Diagnosis not present

## 2021-02-26 DIAGNOSIS — Z7401 Bed confinement status: Secondary | ICD-10-CM | POA: Diagnosis not present

## 2021-02-26 DIAGNOSIS — W19XXXD Unspecified fall, subsequent encounter: Secondary | ICD-10-CM

## 2021-02-26 DIAGNOSIS — Z515 Encounter for palliative care: Secondary | ICD-10-CM

## 2021-02-26 LAB — CBC
HCT: 27 % — ABNORMAL LOW (ref 36.0–46.0)
Hemoglobin: 8.8 g/dL — ABNORMAL LOW (ref 12.0–15.0)
MCH: 30.4 pg (ref 26.0–34.0)
MCHC: 32.6 g/dL (ref 30.0–36.0)
MCV: 93.4 fL (ref 80.0–100.0)
Platelets: 127 10*3/uL — ABNORMAL LOW (ref 150–400)
RBC: 2.89 MIL/uL — ABNORMAL LOW (ref 3.87–5.11)
RDW: 15.3 % (ref 11.5–15.5)
WBC: 6.4 10*3/uL (ref 4.0–10.5)
nRBC: 0 % (ref 0.0–0.2)

## 2021-02-26 LAB — BASIC METABOLIC PANEL
Anion gap: 6 (ref 5–15)
BUN: 24 mg/dL — ABNORMAL HIGH (ref 8–23)
CO2: 24 mmol/L (ref 22–32)
Calcium: 8.2 mg/dL — ABNORMAL LOW (ref 8.9–10.3)
Chloride: 105 mmol/L (ref 98–111)
Creatinine, Ser: 0.84 mg/dL (ref 0.44–1.00)
GFR, Estimated: >60 mL/min (ref >60)
Glucose, Bld: 102 mg/dL — ABNORMAL HIGH (ref 70–99)
Potassium: 4.1 mmol/L (ref 3.5–5.1)
Sodium: 135 mmol/L (ref 135–145)

## 2021-02-26 NOTE — Consult Note (Addendum)
Consultation Note Date: 02/26/2021   Patient Name: Emily Camacho  DOB: 09-Feb-1928  MRN: [id number]  Age / Sex: 88 y.o., female  PCP: [name], DO Referring Physician: Caren Griffins, MD  Reason for Consultation: Establishing goals of care and Pain control  HPI/Patient Profile: 88 y.o. female  with past medical history of HTN, chronic diastolic and systolic CHF, hypothyroidism, dementia (bedbound for 2 years), GERD, and hyperlipidemia admitted on 02/22/2021 s/p unwitnessed fall at her nursing facility.  On 1/31, patient had post retrograde intramedullary nails placed on right and left femurs.  After acute PT evaluation patient's range of motion deficits appear chronic and does not and demonstrate the ability to make significant functional gains in the acute PT setting.  Plan is for patient to discharge to [facility] on Monday if stable.  This is day 4 of patient's hospitalization.  Patient is a DNR.  Clinical Assessment and Goals of Care: I have reviewed medical records including EPIC notes, labs and imaging, assessed the patient and then met with patient at bedside to discuss diagnosis prognosis, GOC, EOL wishes, disposition and options.  Patient is oriented to self but not to time, place, or situation.  She shares she works to [employer] for the [employer], she has 5 children who are barely over the age of 73, and has been married for 25 years.  Given patient has significant dementia she is not able to participate in discussions of goals of care, end-of-life wishes, disposition, or options.  We had a pleasant discussion when she was able to tell me that if she does not move she is not in pain.  She shares the pain medicine is helping control her pain.  After assessing the patient, I attempted to speak with patient's son Dominica Severin over the phone, no answer, and HIPAA appropriate voicemail left.  I  spoke with patient's son [name] over the phone. I introduced Palliative Medicine as specialized medical care for people living with serious illness. It focuses on providing relief from the symptoms and stress of a serious illness. The goal is to improve quality of life for both the patient and the family.    We discussed a brief life review of the patient.  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  When her husband passed away a few years ago, he noticed a significant decline in her cognitive and functional abilities.  As far as functional and nutritional status Morey Hummingbird shares the patient should have had surgeries or procedures to help the patient with her legs.  However, since she did not he believes that this fall has significantly decreased her ability to be able to have any lower extremity range of motion.  He shares the patient can become determined at times, saying that she is going to put her shoes on and get out of the bed and walk.  We discussed patient's current illness and what it means in the larger context of patient's on-going co-morbidities.  Discussed patient's advanced age and  comorbidities and how they contribute to extended recovery times.    Therapeutic silence and active listening provided for Dominica Severin to share his thoughts and emotions regarding his mother's current health status.   I attempted to elicit values and goals of care important to the patient.  Dominica Severin agreed that aggressive medical interventions to attempt to have his mother walk again would be futile considering she has not been able to get up and walk in with several years. He also shares she has not eaten well over the last several months and often does not know what meal she has eaten or is currently eating.   Discussed with patient and Dominica Severin the importance of continued conversation with family and the medical providers regarding overall plan of care and treatment options, ensuring decisions are within  the context of the patients values and GOCs.    Palliative Care services outpatient were explained and offered.  Questions and concerns were addressed. The family was encouraged to call with questions or concerns. PMT will continue to follow the patient throughout her hospitalization.   Primary Decision Maker NEXT OF KIN  Code Status/Advance Care Planning: DNR  Prognosis:   Unable to determine  Discharge Planning:  SNF with outpatient palliative services  Primary Diagnoses: Present on Admission:  Closed displaced spiral fracture of shaft of femur ([place])  Closed fracture of left distal femur ([place])  Fall  Essential hypertension  Hyperlipidemia  Hyperglycemia  Femur fracture ([place])   Physical Exam Vitals and nursing note reviewed.  Constitutional:      General: She is not in acute distress.    Appearance: Normal appearance. She is not ill-appearing or toxic-appearing.  HENT:     Head: Normocephalic.     Mouth/Throat:     Mouth: Mucous membranes are moist.  Eyes:     Pupils: Pupils are equal, round, and reactive to light.  Cardiovascular:     Rate and Rhythm: Normal rate.  Pulmonary:     Effort: Pulmonary effort is normal.  Abdominal:     Palpations: Abdomen is soft.  Musculoskeletal:     Cervical back: Normal range of motion.     Comments: Bedbound, generalized weakness  Skin:    General: Skin is warm.     Comments: Bilateral surgical incision in left and right groin - dressings clean, dry, and intact  Neurological:     Mental Status: She is alert. Mental status is at baseline.     Comments: Oriented to self   Psychiatric:        Mood and Affect: Mood normal.        Behavior: Behavior normal.    Palliative Assessment/Data: 30%     I discussed this patient's plan of care with patient, patient's son Dominica Severin.  Thank you for this consult. Palliative medicine will continue to follow and assist holistically.   Time Total: 90 minutes Greater than 50%  of this  time was spent counseling and coordinating care related to the above assessment and plan.  Signed by: Jordan Hawks, DNP, FNP-BC Palliative Medicine    Please contact Palliative Medicine Team phone at [phone] for questions and concerns.  For individual provider: See Shea Evans

## 2021-02-26 NOTE — Progress Notes (Signed)
PROGRESS NOTE  CONCETTINA LETH QIW:[id number] DOB: [birth year] DOA: 02/22/2021 PCP: Koren Shiver, DO   LOS: 4 days   Brief Narrative / Interim history: ZENNIE AYARS is a 88 y.o. female with medical history significant of hypertension, chronic combined diastolic and systolic CHF, hypothyroidism, dementia, and GERD who presents after having an unwitnessed fall at the nursing facility where she resides.  Work-up revealed bilateral femoral shaft fractures.  Post retrograde intramedullary nailing of the left femur and right femur on 02/22/2021 by Dr. Carola Frost.  Subjective / 24h Interval events: -No complaints  Assessment & Plan: Principal problem Bilateral femoral shaft fractures secondary to unwitnessed fall-patient was admitted to the hospital with by lateral femoral shaft fractures due to an unwitnessed fall.  Orthopedic surgery consulted, she was taken to the OR on 1/31 status postrepair by Dr. Carola Frost.  Continue to monitor postoperatively, pain control and will likely need to go back to SNF.  Patient apparently has been bound for the past 3 years.  DVT prophylaxis per orthopedic surgery  Active problems Acute blood loss anemia postsurgery -following surgery patient had a drop of hemoglobin to 5.9.  She was transfused a total of 3 unit of packed red blood cells over the last 2 days.  Hemoglobin has remained stable  Acute kidney injury on chronic kidney disease stage IIIa-Baseline creatinine around 1.2, in the setting of postop anemia went up to 1.7 but improved, now back to baseline  Acute thrombocytopenia -likely consumptive, postoperatively.  Platelets continue to improve  Hypokalemia-resolved with repletion, potassium 4.1 this morning  Dementia-continue delirium precautions  Essential hypertension -at home she is on lisinopril, metoprolol but here she is normotensive.  Continue to hold home antihypertensives   Hypothyroidism -continue levothyroxine   Hyperlipidemia-on statin     Scheduled Meds:  acetaminophen  650 mg Oral Q8H   acetaminophen  650 mg Oral Once   docusate sodium  100 mg Oral BID   feeding supplement  237 mL Oral TID   furosemide  20 mg Intravenous Once   levothyroxine  25 mcg Oral Daily   oxybutynin  5 mg Oral Daily   rosuvastatin  5 mg Oral QHS   zinc oxide  1 application Topical BID   Continuous Infusions:  sodium chloride     PRN Meds:.HYDROcodone-acetaminophen, menthol-cetylpyridinium **OR** phenol, metoCLOPramide **OR** metoCLOPramide (REGLAN) injection, morphine injection, ondansetron **OR** ondansetron (ZOFRAN) IV, zinc oxide  Diet Orders (From admission, onward)     Start     Ordered   02/23/21 0919  DIET DYS 3 Room service appropriate? Yes with Assist; Fluid consistency: Thin  Diet effective now       Question Answer Comment  Room service appropriate? Yes with Assist   Fluid consistency: Thin      02/23/21 0918            DVT prophylaxis: Place TED hose Start: 02/23/21 1008   Lab Results  Component Value Date   PLT 127 (L) 02/26/2021      Code Status: DNR  Family Communication: No family at bedside  Status is: Inpatient  Remains inpatient appropriate because: Per SW, SNF on Monday  Level of care: Progressive  Consultants:  Orthopedic surgery    Objective: Vitals:   02/25/21 2308 02/26/21 0320 02/26/21 0733 02/26/21 1110  BP: (!) 117/57 (!) 128/51 (!) 123/47 (!) 96/47  Pulse: (!) 107 78 84 82  Resp: 19 17 18 17   Temp: 99.5 F (37.5 C) 97.7 F (36.5 C) 99.1 F (37.3  C) 98.2 F (36.8 C)  TempSrc: Oral Oral Oral Oral  SpO2: 93% 97% 96% 95%  Weight:      Height:        Intake/Output Summary (Last 24 hours) at 02/26/2021 1303 Last data filed at 02/26/2021 0830 Gross per 24 hour  Intake 320 ml  Output 700 ml  Net -380 ml    Wt Readings from Last 3 Encounters:  02/22/21 54.4 kg  02/28/20 54.4 kg  01/05/19 51.2 kg    Examination:  Constitutional: NAD Eyes: lids and conjunctivae normal, no  scleral icterus ENMT: mmm Neck: normal, supple Respiratory: clear to auscultation bilaterally, no wheezing, no crackles. Normal respiratory effort.  Cardiovascular: Regular rate and rhythm, no murmurs / rubs / gallops. No LE edema. Abdomen: soft, no distention, no tenderness. Bowel sounds positive.  Skin: no rashes Neurologic: no focal deficits, equal strength   Data Reviewed: I have independently reviewed following labs and imaging studies   CBC Recent Labs  Lab 02/22/21 0613 02/22/21 1946 02/23/21 0629 02/23/21 0941 02/23/21 2143 02/24/21 0202 02/24/21 1917 02/25/21 0344 02/26/21 0411  WBC 10.1  --  5.5 CANCEL PER KEITH PAUL,PA/GINA,RN/TV1141  --  6.8  --  6.6 6.4  HGB 11.0*   < > 7.3* CANCEL PER KEITH PAUL,PA/GINA,RN/TV1141 7.6* 7.6* 9.0* 8.6* 8.8*  HCT 33.7*   < > 20.9* CANCEL PER KEITH PAUL,PA/GINA,RN/TV1141 22.5* 21.6* 26.3* 24.9* 27.0*  PLT 313  --  90* CANCEL PER KEITH PAUL,PA/GINA,RN/TV1141  --  85*  --  93* 127*  MCV 92.6  --  87.8 CANCEL PER KEITH PAUL,PA/GINA,RN/TV1141  --  88.5  --  91.2 93.4  MCH 30.2  --  30.7 CANCEL PER KEITH PAUL,PA/GINA,RN/TV1141  --  31.1  --  31.5 30.4  MCHC 32.6  --  34.9 CANCEL PER KEITH PAUL,PA/GINA,RN/TV1141  --  35.2  --  34.5 32.6  RDW 13.8  --  14.7 CANCEL PER KEITH PAUL,PA/GINA,RN/TV1141  --  15.2  --  15.3 15.3  LYMPHSABS 1.6  --   --  PENDING  --   --   --   --   --   MONOABS 0.5  --   --  PENDING  --   --   --   --   --   EOSABS 0.2  --   --  PENDING  --   --   --   --   --   BASOSABS 0.0  --   --  PENDING  --   --   --   --   --    < > = values in this interval not displayed.     Recent Labs  Lab 02/22/21 0613 02/23/21 0629 02/24/21 0202 02/25/21 0344 02/26/21 0411  NA 137 139 135 137 135  K 3.1* 4.1 4.3 4.4 4.1  CL 103 106 105 107 105  CO2 22 25 23 24 24   GLUCOSE 202* 142* 119* 110* 102*  BUN 22 23 36* 35* 24*  CREATININE 0.92 1.09* 1.72* 1.16* 0.84  CALCIUM 8.8* 7.8* 7.4* 7.6* 8.2*  AST  --   --   --  18  --    ALT  --   --   --  13  --   ALKPHOS  --   --   --  51  --   BILITOT  --   --   --  0.3  --   ALBUMIN  --   --   --  2.5*  --  HGBA1C  --   --  5.7*  --   --      ------------------------------------------------------------------------------------------------------------------ No results for input(s): CHOL, HDL, LDLCALC, TRIG, CHOLHDL, LDLDIRECT in the last 72 hours.  Lab Results  Component Value Date   HGBA1C 5.7 (H) 02/24/2021   ------------------------------------------------------------------------------------------------------------------ No results for input(s): TSH, T4TOTAL, T3FREE, THYROIDAB in the last 72 hours.  Invalid input(s): FREET3  Cardiac Enzymes No results for input(s): CKMB, TROPONINI, MYOGLOBIN in the last 168 hours.  Invalid input(s): CK ------------------------------------------------------------------------------------------------------------------ No results found for: BNP  CBG: No results for input(s): GLUCAP in the last 168 hours.  Recent Results (from the past 240 hour(s))  Resp Panel by RT-PCR (Flu A&B, Covid) Nasopharyngeal Swab     Status: None   Collection Time: 02/22/21  6:14 AM   Specimen: Nasopharyngeal Swab; Nasopharyngeal(NP) swabs in vial transport medium  Result Value Ref Range Status   SARS Coronavirus 2 by RT PCR NEGATIVE NEGATIVE Final    Comment: (NOTE) SARS-CoV-2 target nucleic acids are NOT DETECTED.  The SARS-CoV-2 RNA is generally detectable in upper respiratory specimens during the acute phase of infection. The lowest concentration of SARS-CoV-2 viral copies this assay can detect is 138 copies/mL. A negative result does not preclude SARS-Cov-2 infection and should not be used as the sole basis for treatment or other patient management decisions. A negative result may occur with  improper specimen collection/handling, submission of specimen other than nasopharyngeal swab, presence of viral mutation(s) within the areas  targeted by this assay, and inadequate number of viral copies(<138 copies/mL). A negative result must be combined with clinical observations, patient history, and epidemiological information. The expected result is Negative.  Fact Sheet for Patients:  BloggerCourse.com  Fact Sheet for Healthcare Providers:  SeriousBroker.it  This test is no t yet approved or cleared by the [place] FDA and  has been authorized for detection and/or diagnosis of SARS-CoV-2 by FDA under an Emergency Use Authorization (EUA). This EUA will remain  in effect (meaning this test can be used) for the duration of the COVID-19 declaration under Section 564(b)(1) of the Act, 21 U.S.C.section 360bbb-3(b)(1), unless the authorization is terminated  or revoked sooner.       Influenza A by PCR NEGATIVE NEGATIVE Final   Influenza B by PCR NEGATIVE NEGATIVE Final    Comment: (NOTE) The Xpert Xpress SARS-CoV-2/FLU/RSV plus assay is intended as an aid in the diagnosis of influenza from Nasopharyngeal swab specimens and should not be used as a sole basis for treatment. Nasal washings and aspirates are unacceptable for Xpert Xpress SARS-CoV-2/FLU/RSV testing.  Fact Sheet for Patients: BloggerCourse.com  Fact Sheet for Healthcare Providers: SeriousBroker.it  This test is not yet approved or cleared by the [place] FDA and has been authorized for detection and/or diagnosis of SARS-CoV-2 by FDA under an Emergency Use Authorization (EUA). This EUA will remain in effect (meaning this test can be used) for the duration of the COVID-19 declaration under Section 564(b)(1) of the Act, 21 U.S.C. section 360bbb-3(b)(1), unless the authorization is terminated or revoked.  Performed at [facility], [address], [place], [place] [address]       Radiology Studies: No results found.   Pamella Pert,  MD, PhD [facility]  Between 7 am - 7 pm I am available, please contact me via Amion (for emergencies) or Securechat (non urgent messages)  Between 7 pm - 7 am I am not available, please contact night coverage MD/APP via Amion

## 2021-02-27 DIAGNOSIS — Z66 Do not resuscitate: Secondary | ICD-10-CM

## 2021-02-27 DIAGNOSIS — S72341D Displaced spiral fracture of shaft of right femur, subsequent encounter for closed fracture with routine healing: Secondary | ICD-10-CM | POA: Diagnosis not present

## 2021-02-27 DIAGNOSIS — Z7189 Other specified counseling: Secondary | ICD-10-CM | POA: Diagnosis not present

## 2021-02-27 DIAGNOSIS — Z7401 Bed confinement status: Secondary | ICD-10-CM | POA: Diagnosis not present

## 2021-02-27 DIAGNOSIS — S72341A Displaced spiral fracture of shaft of right femur, initial encounter for closed fracture: Secondary | ICD-10-CM | POA: Diagnosis not present

## 2021-02-27 LAB — CBC
HCT: 27.8 % — ABNORMAL LOW (ref 36.0–46.0)
Hemoglobin: 9.3 g/dL — ABNORMAL LOW (ref 12.0–15.0)
MCH: 31.2 pg (ref 26.0–34.0)
MCHC: 33.5 g/dL (ref 30.0–36.0)
MCV: 93.3 fL (ref 80.0–100.0)
Platelets: 147 10*3/uL — ABNORMAL LOW (ref 150–400)
RBC: 2.98 MIL/uL — ABNORMAL LOW (ref 3.87–5.11)
RDW: 15.3 % (ref 11.5–15.5)
WBC: 5.2 10*3/uL (ref 4.0–10.5)
nRBC: 0 % (ref 0.0–0.2)

## 2021-02-27 LAB — BASIC METABOLIC PANEL
Anion gap: 7 (ref 5–15)
BUN: 20 mg/dL (ref 8–23)
CO2: 25 mmol/L (ref 22–32)
Calcium: 8.5 mg/dL — ABNORMAL LOW (ref 8.9–10.3)
Chloride: 106 mmol/L (ref 98–111)
Creatinine, Ser: 0.72 mg/dL (ref 0.44–1.00)
GFR, Estimated: >60 mL/min (ref >60)
Glucose, Bld: 106 mg/dL — ABNORMAL HIGH (ref 70–99)
Potassium: 3.8 mmol/L (ref 3.5–5.1)
Sodium: 138 mmol/L (ref 135–145)

## 2021-02-27 MED ORDER — HYDRALAZINE HCL 20 MG/ML IJ SOLN: 10.0000 mg | INTRAMUSCULAR | Status: DC | PRN

## 2021-02-27 MED ORDER — METOPROLOL SUCCINATE ER 25 MG PO TB24
25.0000 mg | ORAL_TABLET | Freq: Every day | ORAL | Status: DC
  Administered 2021-02-27 – 2021-02-28 (×2): 25 mg via ORAL
  Filled 2021-02-27 (×2): qty 1

## 2021-02-27 MED ORDER — LISINOPRIL 20 MG PO TABS
20.0000 mg | ORAL_TABLET | Freq: Every day | ORAL | Status: DC
  Administered 2021-02-27 – 2021-02-28 (×2): 20 mg via ORAL
  Filled 2021-02-27 (×2): qty 1

## 2021-02-27 NOTE — Progress Notes (Signed)
Pt. Pulled off telemetry leads for the second time. RN educated pt. On purpose and importance of leads each time. Pt. Refuses to wear them and removed when reapplied.

## 2021-02-27 NOTE — Progress Notes (Signed)
PROGRESS NOTE  Emily Camacho UXL:[id number] DOB: [birth year] DOA: 02/22/2021 PCP: Koren Shiver, DO   LOS: 5 days   Brief Narrative / Interim history: Emily Camacho is a 88 y.o. female with medical history significant of hypertension, chronic combined diastolic and systolic CHF, hypothyroidism, dementia, and GERD who presents after having an unwitnessed fall at the nursing facility where she resides.  Work-up revealed bilateral femoral shaft fractures.  Post retrograde intramedullary nailing of the left femur and right femur on 02/22/2021 by Dr. Carola Frost.  Subjective / 24h Interval events: -Complains of pain in her legs  Assessment & Plan: Principal problem Bilateral femoral shaft fractures secondary to unwitnessed fall-patient was admitted to the hospital with by lateral femoral shaft fractures due to an unwitnessed fall.  Orthopedic surgery consulted, she was taken to the OR on 1/31 status postrepair by Dr. Carola Frost.  Continue to monitor postoperatively, pain control and will likely need to go back to SNF.  Patient apparently has been bound for the past 3 years.  DVT prophylaxis per orthopedic surgery  Active problems Acute blood loss anemia postsurgery -following surgery patient had a drop of hemoglobin to 5.9.  She was transfused a total of 3 unit of packed red blood cells.  Hemoglobin has remained stable  Acute kidney injury on chronic kidney disease stage IIIa-Baseline creatinine around 1.2, in the setting of postop anemia went up to 1.7 but improved, now back to baseline  Acute thrombocytopenia -likely consumptive, postoperatively.  Platelets continue to improve  Hypokalemia-resolved with repletion, potassium remained stable  Dementia-continue delirium precautions  Essential hypertension -at home she is on lisinopril, metoprolol which were held initially, but blood pressure increasing today, resume metoprolol initially   Hypothyroidism -continue levothyroxine   Hyperlipidemia-on  statin    Scheduled Meds:  acetaminophen  650 mg Oral Q8H   acetaminophen  650 mg Oral Once   docusate sodium  100 mg Oral BID   feeding supplement  237 mL Oral TID   furosemide  20 mg Intravenous Once   levothyroxine  25 mcg Oral Daily   oxybutynin  5 mg Oral Daily   rosuvastatin  5 mg Oral QHS   zinc oxide  1 application Topical BID   Continuous Infusions:  sodium chloride     PRN Meds:.HYDROcodone-acetaminophen, menthol-cetylpyridinium **OR** phenol, metoCLOPramide **OR** metoCLOPramide (REGLAN) injection, morphine injection, ondansetron **OR** ondansetron (ZOFRAN) IV, zinc oxide  Diet Orders (From admission, onward)     Start     Ordered   02/23/21 0919  DIET DYS 3 Room service appropriate? Yes with Assist; Fluid consistency: Thin  Diet effective now       Question Answer Comment  Room service appropriate? Yes with Assist   Fluid consistency: Thin      02/23/21 0918            DVT prophylaxis: Place TED hose Start: 02/23/21 1008   Lab Results  Component Value Date   PLT 127 (L) 02/26/2021      Code Status: DNR  Family Communication: No family at bedside  Status is: Inpatient  Remains inpatient appropriate because: Per SW, SNF on Monday  Level of care: Progressive  Consultants:  Orthopedic surgery    Objective: Vitals:   02/26/21 1921 02/26/21 2311 02/27/21 0319 02/27/21 0750  BP: (!) 143/86 (!) 119/57 (!) 147/53 (!) 154/100  Pulse: 99 (!) 106 89 96  Resp: 20   19  Temp: 98.8 F (37.1 C) 98.3 F (36.8 C) 98.2 F (36.8 C)  98.9 F (37.2 C)  TempSrc: Oral Oral Oral Oral  SpO2: 94%  93% 100%  Weight:      Height:        Intake/Output Summary (Last 24 hours) at 02/27/2021 1023 Last data filed at 02/27/2021 0350 Gross per 24 hour  Intake 240 ml  Output 1400 ml  Net -1160 ml    Wt Readings from Last 3 Encounters:  02/22/21 54.4 kg  02/28/20 54.4 kg  01/05/19 51.2 kg    Examination:  Constitutional: NAD Eyes: lids and conjunctivae  normal, no scleral icterus ENMT: mmm Neck: normal, supple Respiratory: clear to auscultation bilaterally, no wheezing, no crackles. Normal respiratory effort.  Cardiovascular: Regular rate and rhythm, no murmurs / rubs / gallops. No LE edema. Abdomen: soft, no distention, no tenderness. Bowel sounds positive.  Skin: no rashes Neurologic: no focal deficits, equal strength   Data Reviewed: I have independently reviewed following labs and imaging studies   CBC Recent Labs  Lab 02/22/21 0613 02/22/21 1946 02/23/21 0629 02/23/21 0941 02/23/21 2143 02/24/21 0202 02/24/21 1917 02/25/21 0344 02/26/21 0411  WBC 10.1  --  5.5 CANCEL PER KEITH PAUL,PA/GINA,RN/TV1141  --  6.8  --  6.6 6.4  HGB 11.0*   < > 7.3* CANCEL PER KEITH PAUL,PA/GINA,RN/TV1141 7.6* 7.6* 9.0* 8.6* 8.8*  HCT 33.7*   < > 20.9* CANCEL PER KEITH PAUL,PA/GINA,RN/TV1141 22.5* 21.6* 26.3* 24.9* 27.0*  PLT 313  --  90* CANCEL PER KEITH PAUL,PA/GINA,RN/TV1141  --  85*  --  93* 127*  MCV 92.6  --  87.8 CANCEL PER KEITH PAUL,PA/GINA,RN/TV1141  --  88.5  --  91.2 93.4  MCH 30.2  --  30.7 CANCEL PER KEITH PAUL,PA/GINA,RN/TV1141  --  31.1  --  31.5 30.4  MCHC 32.6  --  34.9 CANCEL PER KEITH PAUL,PA/GINA,RN/TV1141  --  35.2  --  34.5 32.6  RDW 13.8  --  14.7 CANCEL PER KEITH PAUL,PA/GINA,RN/TV1141  --  15.2  --  15.3 15.3  LYMPHSABS 1.6  --   --  PENDING  --   --   --   --   --   MONOABS 0.5  --   --  PENDING  --   --   --   --   --   EOSABS 0.2  --   --  PENDING  --   --   --   --   --   BASOSABS 0.0  --   --  PENDING  --   --   --   --   --    < > = values in this interval not displayed.     Recent Labs  Lab 02/22/21 0613 02/23/21 0629 02/24/21 0202 02/25/21 0344 02/26/21 0411  NA 137 139 135 137 135  K 3.1* 4.1 4.3 4.4 4.1  CL 103 106 105 107 105  CO2 22 25 23 24 24   GLUCOSE 202* 142* 119* 110* 102*  BUN 22 23 36* 35* 24*  CREATININE 0.92 1.09* 1.72* 1.16* 0.84  CALCIUM 8.8* 7.8* 7.4* 7.6* 8.2*  AST  --   --   --   18  --   ALT  --   --   --  13  --   ALKPHOS  --   --   --  51  --   BILITOT  --   --   --  0.3  --   ALBUMIN  --   --   --  2.5*  --  HGBA1C  --   --  5.7*  --   --      ------------------------------------------------------------------------------------------------------------------ No results for input(s): CHOL, HDL, LDLCALC, TRIG, CHOLHDL, LDLDIRECT in the last 72 hours.  Lab Results  Component Value Date   HGBA1C 5.7 (H) 02/24/2021   ------------------------------------------------------------------------------------------------------------------ No results for input(s): TSH, T4TOTAL, T3FREE, THYROIDAB in the last 72 hours.  Invalid input(s): FREET3  Cardiac Enzymes No results for input(s): CKMB, TROPONINI, MYOGLOBIN in the last 168 hours.  Invalid input(s): CK ------------------------------------------------------------------------------------------------------------------ No results found for: BNP  CBG: No results for input(s): GLUCAP in the last 168 hours.  Recent Results (from the past 240 hour(s))  Resp Panel by RT-PCR (Flu A&B, Covid) Nasopharyngeal Swab     Status: None   Collection Time: 02/22/21  6:14 AM   Specimen: Nasopharyngeal Swab; Nasopharyngeal(NP) swabs in vial transport medium  Result Value Ref Range Status   SARS Coronavirus 2 by RT PCR NEGATIVE NEGATIVE Final    Comment: (NOTE) SARS-CoV-2 target nucleic acids are NOT DETECTED.  The SARS-CoV-2 RNA is generally detectable in upper respiratory specimens during the acute phase of infection. The lowest concentration of SARS-CoV-2 viral copies this assay can detect is 138 copies/mL. A negative result does not preclude SARS-Cov-2 infection and should not be used as the sole basis for treatment or other patient management decisions. A negative result may occur with  improper specimen collection/handling, submission of specimen other than nasopharyngeal swab, presence of viral mutation(s) within  the areas targeted by this assay, and inadequate number of viral copies(<138 copies/mL). A negative result must be combined with clinical observations, patient history, and epidemiological information. The expected result is Negative.  Fact Sheet for Patients:  BloggerCourse.com  Fact Sheet for Healthcare Providers:  SeriousBroker.it  This test is no t yet approved or cleared by the [place] FDA and  has been authorized for detection and/or diagnosis of SARS-CoV-2 by FDA under an Emergency Use Authorization (EUA). This EUA will remain  in effect (meaning this test can be used) for the duration of the COVID-19 declaration under Section 564(b)(1) of the Act, 21 U.S.C.section 360bbb-3(b)(1), unless the authorization is terminated  or revoked sooner.       Influenza A by PCR NEGATIVE NEGATIVE Final   Influenza B by PCR NEGATIVE NEGATIVE Final    Comment: (NOTE) The Xpert Xpress SARS-CoV-2/FLU/RSV plus assay is intended as an aid in the diagnosis of influenza from Nasopharyngeal swab specimens and should not be used as a sole basis for treatment. Nasal washings and aspirates are unacceptable for Xpert Xpress SARS-CoV-2/FLU/RSV testing.  Fact Sheet for Patients: BloggerCourse.com  Fact Sheet for Healthcare Providers: SeriousBroker.it  This test is not yet approved or cleared by the [place] FDA and has been authorized for detection and/or diagnosis of SARS-CoV-2 by FDA under an Emergency Use Authorization (EUA). This EUA will remain in effect (meaning this test can be used) for the duration of the COVID-19 declaration under Section 564(b)(1) of the Act, 21 U.S.C. section 360bbb-3(b)(1), unless the authorization is terminated or revoked.  Performed at [facility], [address] [place], [place] [address]       Radiology Studies: No results  found.   Pamella Pert, MD, PhD [facility]  Between 7 am - 7 pm I am available, please contact me via Amion (for emergencies) or Securechat (non urgent messages)  Between 7 pm - 7 am I am not available, please contact night coverage MD/APP via Amion

## 2021-02-27 NOTE — Progress Notes (Signed)
Progress note  HPI: 88 y.o. female  with past medical history of HTN, chronic diastolic and systolic CHF, hypothyroidism, dementia (bedbound for 2 years), GERD, and hyperlipidemia admitted on 02/22/2021 s/p unwitnessed fall at her nursing facility.  On 1/31, patient had post retrograde intramedullary nails placed on right and left femurs.  After acute PT evaluation patient's range of motion deficits appear chronic and does not and demonstrate the ability to make significant functional gains in the acute PT setting.  Summary: After reviewing the patient's chart, I assessed the patient at bedside.  She was asleep but was easily arousable.  She complained of mild pain in her knees.  Patient did not know why she was in the hospital or what the root cause of her pain was.  I reminded patient that she had fallen and had undergone surgical repair of her right and left femur.  Patient shared she was ready to get up, put her shoes on, and get outside into the sunshine.  I reminded her that she is not able to stand and walk without assistance.  She shared she would like to get more rest before making a move to go outside.  Discussed with patient importance of communicating if her pain starts to increase.  Also reiterated that patient should call and ask for assistance with any mobility.  No family at bedside.  As per chart review, patient is planned to discharge to SNF tomorrow.  Patient remains a DNR.  Goals are clear.  Palliative medicine team will continue to follow patient throughout her hospitalization but shadow her chart.  Please reach out to the PMT for any palliative needs, if patient declines, or at family's request.  Samara Deist L. Manon Hilding, FNP-BC Palliative Medicine Team Team Phone # [phone]

## 2021-02-28 DIAGNOSIS — S72402D Unspecified fracture of lower end of left femur, subsequent encounter for closed fracture with routine healing: Secondary | ICD-10-CM

## 2021-02-28 DIAGNOSIS — E785 Hyperlipidemia, unspecified: Secondary | ICD-10-CM | POA: Diagnosis not present

## 2021-02-28 DIAGNOSIS — I10 Essential (primary) hypertension: Secondary | ICD-10-CM | POA: Diagnosis not present

## 2021-02-28 LAB — SARS CORONAVIRUS 2 (TAT 6-24 HRS): SARS Coronavirus 2: NEGATIVE

## 2021-02-28 MED ORDER — ENOXAPARIN SODIUM 30 MG/0.3ML IJ SOSY: 30.0000 mg | PREFILLED_SYRINGE | INTRAMUSCULAR | Status: AC

## 2021-02-28 MED ORDER — HYDROCODONE-ACETAMINOPHEN 5-325 MG PO TABS: 1.0000 | ORAL_TABLET | Freq: Four times a day (QID) | ORAL | 0 refills | Status: AC | PRN

## 2021-02-28 MED ORDER — ASPIRIN EC 81 MG PO TBEC
81.0000 mg | DELAYED_RELEASE_TABLET | Freq: Every day | ORAL | 2 refills | Status: AC
Start: 2021-02-28 — End: 2022-02-28

## 2021-02-28 MED ORDER — ENOXAPARIN SODIUM 30 MG/0.3ML IJ SOSY
30.0000 mg | PREFILLED_SYRINGE | INTRAMUSCULAR | Status: DC
  Administered 2021-02-28: 30 mg via SUBCUTANEOUS
  Filled 2021-02-28: qty 0.3

## 2021-02-28 NOTE — Discharge Instructions (Addendum)
[facility] Discharge Instructions   General Discharge Instructions  Orthopaedic Injuries:  Bilateral femur fractures treated with intramedullary nailing   WEIGHT BEARING STATUS: Nonweightbearing Bilateral lower extremities   RANGE OF MOTION/ACTIVITY: motion as tolerated all joints in lower extremities.   Bone health: fractures suggestive of osteoporosis/fragility fracture.  This is likely from prolonged non-ambulation  Review the following resource for additional information regarding bone health  [email or web address]  Wound Care: daily wound care as needed. Ok to leave open to the air. Clean wounds with soap and water only   DVT/PE prophylaxis: Lovenox 30 mg sq daily x 21 days   Diet: as you were eating previously.  Can use over the counter stool softeners and bowel preparations, such as Miralax, to help with bowel movements.  Narcotics can be constipating.  Be sure to drink plenty of fluids  PAIN MEDICATION USE AND EXPECTATIONS  You have likely been given narcotic medications to help control your pain.  After a traumatic event that results in an fracture (broken bone) with or without surgery, it is ok to use narcotic pain medications to help control one's pain.  We understand that everyone responds to pain differently and each individual patient will be evaluated on a regular basis for the continued need for narcotic medications. Ideally, narcotic medication use should last no more than 6-8 weeks (coinciding with fracture healing).   As a patient it is your responsibility as well to monitor narcotic medication use and report the amount and frequency you use these medications when you come to your office visit.   We would also advise that if you are using narcotic medications, you should take a dose prior to therapy to maximize you participation.  IF YOU ARE ON NARCOTIC MEDICATIONS IT IS NOT PERMISSIBLE TO OPERATE A MOTOR VEHICLE  (MOTORCYCLE/CAR/TRUCK/MOPED) OR HEAVY MACHINERY DO NOT MIX NARCOTICS WITH OTHER CNS (CENTRAL NERVOUS SYSTEM) DEPRESSANTS SUCH AS ALCOHOL   POST-OPERATIVE OPIOID TAPER INSTRUCTIONS: It is important to wean off of your opioid medication as soon as possible. If you do not need pain medication after your surgery it is ok to stop day one. Opioids include: Codeine, Hydrocodone(Norco, Vicodin), Oxycodone(Percocet, oxycontin) and hydromorphone amongst others.  Long term and even short term use of opiods can cause: Increased pain response Dependence Constipation Depression Respiratory depression And more.  Withdrawal symptoms can include Flu like symptoms Nausea, vomiting And more Techniques to manage these symptoms Hydrate well Eat regular healthy meals Stay active Use relaxation techniques(deep breathing, meditating, yoga) Do Not substitute Alcohol to help with tapering If you have been on opioids for less than two weeks and do not have pain than it is ok to stop all together.  Plan to wean off of opioids This plan should start within one week post op of your fracture surgery  Maintain the same interval or time between taking each dose and first decrease the dose.  Cut the total daily intake of opioids by one tablet each day Next start to increase the time between doses. The last dose that should be eliminated is the evening dose.    STOP SMOKING OR USING NICOTINE PRODUCTS!!!!  As discussed nicotine severely impairs your body's ability to heal surgical and traumatic wounds but also impairs bone healing.  Wounds and bone heal by forming microscopic blood vessels (angiogenesis) and nicotine is a vasoconstrictor (essentially, shrinks blood vessels).  Therefore, if vasoconstriction occurs to these microscopic blood vessels they essentially disappear and are unable to deliver necessary nutrients  to the healing tissue.  This is one modifiable factor that you can do to dramatically increase your  chances of healing your injury.    (This means no smoking, no nicotine gum, patches, etc)  DO NOT USE NONSTEROIDAL ANTI-INFLAMMATORY DRUGS (NSAID'S)  Using products such as Advil (ibuprofen), Aleve (naproxen), Motrin (ibuprofen) for additional pain control during fracture healing can delay and/or prevent the healing response.  If you would like to take over the counter (OTC) medication, Tylenol (acetaminophen) is ok.  However, some narcotic medications that are given for pain control contain acetaminophen as well. Therefore, you should not exceed more than 4000 mg of tylenol in a day if you do not have liver disease.  Also note that there are may OTC medicines, such as cold medicines and allergy medicines that my contain tylenol as well.  If you have any questions about medications and/or interactions please ask your doctor/PA or your pharmacist.      ICE AND ELEVATE INJURED/OPERATIVE EXTREMITY  Using ice and elevating the injured extremity above your heart can help with swelling and pain control.  Icing in a pulsatile fashion, such as 20 minutes on and 20 minutes off, can be followed.    Do not place ice directly on skin. Make sure there is a barrier between to skin and the ice pack.    Using frozen items such as frozen peas works well as the conform nicely to the are that needs to be iced.  USE AN ACE WRAP OR TED HOSE FOR SWELLING CONTROL  In addition to icing and elevation, Ace wraps or TED hose are used to help limit and resolve swelling.  It is recommended to use Ace wraps or TED hose until you are informed to stop.    When using Ace Wraps start the wrapping distally (farthest away from the body) and wrap proximally (closer to the body)   Example: If you had surgery on your leg or thing and you do not have a splint on, start the ace wrap at the toes and work your way up to the thigh        If you had surgery on your upper extremity and do not have a splint on, start the ace wrap at your fingers  and work your way up to the upper arm  IF YOU ARE IN A SPLINT OR CAST DO NOT REMOVE IT FOR ANY REASON   If your splint gets wet for any reason please contact the office immediately. You may shower in your splint or cast as long as you keep it dry.  This can be done by wrapping in a cast cover or garbage back (or similar)  Do Not stick any thing down your splint or cast such as pencils, money, or hangers to try and scratch yourself with.  If you feel itchy take benadryl as prescribed on the bottle for itching  IF YOU ARE IN A CAM BOOT (BLACK BOOT)  You may remove boot periodically. Perform daily dressing changes as noted below.  Wash the liner of the boot regularly and wear a sock when wearing the boot. It is recommended that you sleep in the boot until told otherwise    Call office for the following: Temperature greater than 101F Persistent nausea and vomiting Severe uncontrolled pain Redness, tenderness, or signs of infection (pain, swelling, redness, odor or green/yellow discharge around the site) Difficulty breathing, headache or visual disturbances Hives Persistent dizziness or light-headedness Extreme fatigue Any other questions or  concerns you may have after discharge  In an emergency, call 911 or go to an Emergency Department at a nearby hospital  HELPFUL INFORMATION  If you had a block, it will wear off between 8-24 hrs postop typically.  This is period when your pain may go from nearly zero to the pain you would have had postop without the block.  This is an abrupt transition but nothing dangerous is happening.  You may take an extra dose of narcotic when this happens.  You should wean off your narcotic medicines as soon as you are able.  Most patients will be off or using minimal narcotics before their first postop appointment.   We suggest you use the pain medication the first night prior to going to bed, in order to ease any pain when the anesthesia wears off. You should  avoid taking pain medications on an empty stomach as it will make you nauseous.  Do not drink alcoholic beverages or take illicit drugs when taking pain medications.  In most states it is against the law to drive while you are in a splint or sling.  And certainly against the law to drive while taking narcotics.  You may return to work/school in the next couple of days when you feel up to it.   Pain medication may make you constipated.  Below are a few solutions to try in this order: Decrease the amount of pain medication if you arent having pain. Drink lots of decaffeinated fluids. Drink prune juice and/or each dried prunes  If the first 3 dont work start with additional solutions Take Colace - an over-the-counter stool softener Take Senokot - an over-the-counter laxative Take Miralax - a stronger over-the-counter laxative     CALL THE OFFICE WITH ANY QUESTIONS OR CONCERNS: [phone]   VISIT OUR WEBSITE FOR ADDITIONAL INFORMATION: [email or web address]

## 2021-02-28 NOTE — Discharge Summary (Signed)
Physician Discharge Summary  Emily Camacho [id number] DOB: 1928/09/13 DOA: 02/22/2021  PCP: [name], DO  Admit date: 02/22/2021 Discharge date: 02/28/2021  Admitted From: SNF Disposition:  SNF  Recommendations for Outpatient Follow-up:  Follow up with PCP and Orthopedic surgery in 1-2 weeks Please obtain BMP/CBC in one week  Home Health: none Equipment/Devices: none  Discharge Condition: stable CODE STATUS: DNR Diet recommendation: regular  HPI: Per admitting MD, Emily Camacho is a 88 y.o. female with medical history significant of hypertension, CHF, hypothyroidism, dementia, and GERD presents after having an unwitnessed fall at the nursing facility where she resides.  At baseline patient has pretty significant dementia and will mix up dates and facts as noted per her son who is sitting at bedside.  Normally she can recognize family members, but reports that she has been bedbound for 2 years.  She was noted to complain of pain in her legs and it was reported that she had a deformity of the right ankle with pulse present. In route with EMS patient had been given fentanyl 100 mcg.  To the son's knowledge patient had last been doing well.  The patient's son concerns that the patient had a living Will where he is the healthcare power of attorney, but also stated her wishes in further detail.  She would not want CPR, intubation, feeding tubes, or any other aggressive treatments.  The patient had signed paperwork stating that she was okay for with pain medications.  Patient's son notes that he is okay with the patient going to surgery because the only other option is for her to be a cast which would make her overall care more difficult in a nursing facility.  Hospital Course / Discharge diagnoses: Principal problem Bilateral femoral shaft fractures secondary to unwitnessed fall-patient was admitted to the hospital with by lateral femoral shaft fractures due to an unwitnessed  fall.  Orthopedic surgery consulted, she was taken to the OR on 1/31 status postrepair by Dr. Marcelino Scot.  Continue to monitor postoperatively, pain control and will to go back to SNF.  Patient apparently has been bound for the past 3 years.  DVT prophylaxis per orthopedic surgery with Lovenox 30 mg Q24h for 21 days. To minimize risk of bleeding home Aspirin dose was reduced to 81 mg. Once Lovenox is completed she could return to prior medication regimen.    Active problems Acute blood loss anemia postsurgery -following surgery patient had a drop of hemoglobin to 5.9.  She was transfused a total of 3 unit of packed red blood cells.  Hemoglobin has remained stable and now improving on its own  Acute kidney injury on chronic kidney disease stage IIIa-Baseline creatinine around 1.2, in the setting of postop anemia went up to 1.7 but improved, now back to baseline  Acute thrombocytopenia -likely consumptive, postoperatively.  Platelets continue to improve and almost normalized  Hypokalemia-resolved with repletion, potassium remained stable Dementia-continue delirium precautions Essential hypertension -continue home medications Hypothyroidism -continue levothyroxine Hyperlipidemia-on statin  Sepsis ruled out   Discharge Instructions   Allergies as of 02/28/2021       Reactions   Penicillins    Did it involve swelling of the face/tongue/throat, SOB, or low BP? no Did it involve sudden or severe rash/hives, skin peeling, or any reaction on the inside of your mouth or nose? Yes  Did you need to seek medical attention at a hospital or doctor's office? N/A When did it last happen? unknown  If all above  answers are "NO", may proceed with cephalosporin use.   Sulfonamide Derivatives    Latex Rash        Medication List     STOP taking these medications    aspirin 325 MG tablet Replaced by: aspirin EC 81 MG tablet       TAKE these medications    acetaminophen 650 MG CR tablet Commonly  known as: TYLENOL Take 650 mg by mouth every 4 (four) hours as needed (general discomfort).   aspirin EC 81 MG tablet Take 1 tablet (81 mg total) by mouth daily. Swallow whole. Replaces: aspirin 325 MG tablet   CENTRUM PO Take 1 tablet by mouth daily at 12 noon. 1 tab po qd   cholecalciferol 25 MCG (1000 UNIT) tablet Commonly known as: VITAMIN D3 Take 400 Units by mouth 2 (two) times daily.   enoxaparin 30 MG/0.3ML injection Commonly known as: LOVENOX Inject 0.3 mLs (30 mg total) into the skin daily for 21 days.   Ensure Plus Liqd Take 237 mLs by mouth 3 (three) times daily.   HYDROcodone-acetaminophen 5-325 MG tablet Commonly known as: NORCO/VICODIN Take 1 tablet by mouth every 6 (six) hours as needed for moderate pain.   ipratropium-albuterol 0.5-2.5 (3) MG/3ML Soln Commonly known as: DUONEB Take 3 mLs by nebulization every 8 (eight) hours as needed (shortness of breath/wheezing).   levocetirizine 5 MG tablet Commonly known as: XYZAL Take 5 mg by mouth daily.   levothyroxine 25 MCG tablet Commonly known as: SYNTHROID Take 25 mcg by mouth daily.   lisinopril 20 MG tablet Commonly known as: ZESTRIL Take 1 tablet (20 mg total) by mouth daily.   metoprolol succinate 25 MG 24 hr tablet Commonly known as: TOPROL-XL Take 25-50 mg by mouth daily.   ondansetron 4 MG disintegrating tablet Commonly known as: Zofran ODT Take 1 tablet (4 mg total) by mouth every 8 (eight) hours as needed for nausea or vomiting.   oxybutynin 5 MG 24 hr tablet Commonly known as: DITROPAN-XL Take 5 mg by mouth daily.   rosuvastatin 5 MG tablet Commonly known as: CRESTOR Take 5 mg by mouth at bedtime.   traMADol 50 MG tablet Commonly known as: ULTRAM Take 50 mg by mouth as needed for moderate pain or severe pain. Take if no relief from tylenol   zinc oxide 20 % ointment Apply 1 application topically See admin instructions. Apply to sacrum every shift for wound   zinc oxide 20 %  ointment Apply 1 application topically as needed for irritation.        Consultations: Orthopedic surgery   Procedures/Studies:  DG Knee 1-2 Views Left  Result Date: 02/22/2021 CLINICAL DATA:  Fall EXAM: LEFT KNEE - 1-2 VIEW COMPARISON:  None. FINDINGS: There is a mildly displaced oblique fracture of the distal femoral metaphysis which extends towards the epiphysis without definite evidence of intra-articular extension. Knee alignment appears maintained. IMPRESSION: Mildly displaced fracture of the distal femoral metaphysis with extension towards the epiphysis without definite evidence of intra-articular extension. Electronically Signed   By: Valetta Mole [name]   On: 02/22/2021 08:12   DG Ankle Complete Right  Result Date: 02/22/2021 CLINICAL DATA:  88 year old female found down after fall. Right lower extremity deformity. EXAM: RIGHT ANKLE - COMPLETE 3+ VIEW COMPARISON:  Right ankle series 12/31/2018. FINDINGS: Mortise joint alignment appears stable since 2020, with osteopenia and no definite acute fracture of the distal tibia or fibula. Talus and calcaneus appear stable, with chronic pes planus and a degree of  chronic midfoot collapse. Right foot series is reported separately today. Calcified peripheral vascular disease. IMPRESSION: Osteopenia with chronic pes planus and mid/hind-foot collapse. No definite acute fracture about the right ankle. Electronically Signed   By: Odessa Fleming [name]   On: 02/22/2021 07:15   CT Head Wo Contrast  Result Date: 02/22/2021 CLINICAL DATA:  Head trauma, minor EXAM: CT HEAD WITHOUT CONTRAST CT CERVICAL SPINE WITHOUT CONTRAST TECHNIQUE: Multidetector CT imaging of the head and cervical spine was performed following the standard protocol without intravenous contrast. Multiplanar CT image reconstructions of the cervical spine were also generated. RADIATION DOSE REDUCTION: This exam was performed according to the departmental dose-optimization program which includes  automated exposure control, adjustment of the mA and/or kV according to patient size and/or use of iterative reconstruction technique. COMPARISON:  CT head dated December 31, 2018 FINDINGS: CT HEAD FINDINGS Brain: No evidence of acute infarction, hemorrhage, hydrocephalus, extra-axial collection or mass lesion/mass effect. Moderate cerebral atrophy and chronic microvascular ischemic changes of the white matter, unchanged Vascular: No hyperdense vessel or unexpected calcification. Skull: Normal. Negative for fracture or focal lesion. Sinuses/Orbits: Bilateral cataract surgery.  Acute abnormality. Other: None. CT CERVICAL SPINE FINDINGS Alignment: Exaggeration of normal cervical lordosis. Mild anterolisthesis of C4. Skull base and vertebrae: No acute fracture. No primary bone lesion or focal pathologic process. Soft tissues and spinal canal: No prevertebral fluid or swelling. No visible canal hematoma. Disc levels: Advanced degenerate disc disease with osseous fusion at C5-C6 and near complete loss of disc space with prominent osteophytes at C6-C7. C2-C3:  No significant finding. C3-C4:  No significant spinal canal or neural foraminal narrowing. C4-C5:  No significant spinal canal or neural foraminal narrowing. C5-C6: Osseous fusion with mild spinal canal and bilateral neural foraminal narrowing, left worse than the right. C6-C7: Disc osteophyte complex with mild spinal canal and bilateral neural foraminal narrowing. Moderate uncovertebral joint and facet joint arthropathy. C7-T1:  Significant spinal canal or neural foraminal narrowing. Upper chest: No acute abnormality. Other: None IMPRESSION: 1. No acute intracranial abnormality. Cerebral atrophy and chronic microvascular ischemic changes. 2.  No acute fracture. 3. Advanced multilevel degenerative disc disease most prominent at C5-C6 and C6-C7. Electronically Signed   By: Larose Hires D.O.   On: 02/22/2021 09:10   CT Cervical Spine Wo Contrast  Result Date:  02/22/2021 CLINICAL DATA:  Head trauma, minor EXAM: CT HEAD WITHOUT CONTRAST CT CERVICAL SPINE WITHOUT CONTRAST TECHNIQUE: Multidetector CT imaging of the head and cervical spine was performed following the standard protocol without intravenous contrast. Multiplanar CT image reconstructions of the cervical spine were also generated. RADIATION DOSE REDUCTION: This exam was performed according to the departmental dose-optimization program which includes automated exposure control, adjustment of the mA and/or kV according to patient size and/or use of iterative reconstruction technique. COMPARISON:  CT head dated December 31, 2018 FINDINGS: CT HEAD FINDINGS Brain: No evidence of acute infarction, hemorrhage, hydrocephalus, extra-axial collection or mass lesion/mass effect. Moderate cerebral atrophy and chronic microvascular ischemic changes of the white matter, unchanged Vascular: No hyperdense vessel or unexpected calcification. Skull: Normal. Negative for fracture or focal lesion. Sinuses/Orbits: Bilateral cataract surgery.  Acute abnormality. Other: None. CT CERVICAL SPINE FINDINGS Alignment: Exaggeration of normal cervical lordosis. Mild anterolisthesis of C4. Skull base and vertebrae: No acute fracture. No primary bone lesion or focal pathologic process. Soft tissues and spinal canal: No prevertebral fluid or swelling. No visible canal hematoma. Disc levels: Advanced degenerate disc disease with osseous fusion at C5-C6 and near complete loss  of disc space with prominent osteophytes at C6-C7. C2-C3:  No significant finding. C3-C4:  No significant spinal canal or neural foraminal narrowing. C4-C5:  No significant spinal canal or neural foraminal narrowing. C5-C6: Osseous fusion with mild spinal canal and bilateral neural foraminal narrowing, left worse than the right. C6-C7: Disc osteophyte complex with mild spinal canal and bilateral neural foraminal narrowing. Moderate uncovertebral joint and facet joint  arthropathy. C7-T1:  Significant spinal canal or neural foraminal narrowing. Upper chest: No acute abnormality. Other: None IMPRESSION: 1. No acute intracranial abnormality. Cerebral atrophy and chronic microvascular ischemic changes. 2.  No acute fracture. 3. Advanced multilevel degenerative disc disease most prominent at C5-C6 and C6-C7. Electronically Signed   By: Keane Police D.O.   On: 02/22/2021 09:10   DG Chest Portable 1 View  Result Date: 02/22/2021 CLINICAL DATA:  88 year old female found down after fall. Right lower extremity deformity. EXAM: PORTABLE CHEST 1 VIEW COMPARISON:  Portable chest 12/27/2020 and earlier. FINDINGS: Portable AP supine view at 0638 hours. Chronic large hiatal hernia with increased gastric air today. Stable cardiac size and mediastinal contours. Calcified aortic atherosclerosis. No pneumothorax identified on this supine view. No pulmonary edema. No definite effusion. Chronic atelectasis associated with the hernia. Chronic appearing right lateral 5th rib fracture. No acute osseous abnormality identified. Negative visible bowel gas. IMPRESSION: 1. No acute cardiopulmonary abnormality. 2. Chronic large hiatal hernia. Electronically Signed   By: Genevie Ann [name]   On: 02/22/2021 07:13   DG Knee Right Port  Result Date: 02/22/2021 CLINICAL DATA:  88 year old female found down after fall. Right ankle deformity. EXAM: PORTABLE RIGHT KNEE - 1-2 VIEW COMPARISON:  Right hip series today. Right knee series 12/31/2018. FINDINGS: Spiral fracture distal right femoral shaft. Overriding about 2.5 cm. 1/2 shaft width lateral displacement. Similar mild anterior displacement. Mild anterior and medial angulation. Comminution fragments better demonstrated on the right hip series today. Joint spaces and alignment at the right knee appears stable with chronic tricompartmental degeneration. Proximal right tibia and fibula appear stable and intact. Calcified peripheral vascular disease. IMPRESSION: 1.  Spiral fracture of the distal right femoral shaft with mild displacement and angulation. Comminution fragments better demonstrated on right hip series today. 2. Right knee remains intact, with chronic tricompartmental degeneration. 3. Calcified peripheral vascular disease. Electronically Signed   By: Genevie Ann [name]   On: 02/22/2021 07:11   DG Foot 2 Views Right  Result Date: 02/22/2021 CLINICAL DATA:  88 year old female found down after fall. Right lower extremity deformity. EXAM: RIGHT FOOT - 2 VIEW COMPARISON:  Right foot series 07/13/2009. FINDINGS: Diffuse osteopenia. Chronic pes planus and mid to hindfoot collapse which has not significantly changed from 20/11. No superimposed acute fracture or dislocation identified in the right foot. Calcified peripheral vascular disease. IMPRESSION: 1. No acute fracture or dislocation identified about the right foot. 2. Chronic osteopenia, pes planus and mid or hindfoot collapse. Electronically Signed   By: Genevie Ann [name]   On: 02/22/2021 07:16   DG C-Arm 1-60 Min-No Report  Result Date: 02/22/2021 Fluoroscopy was utilized by the requesting physician.  No radiographic interpretation.   DG C-Arm 1-60 Min-No Report  Result Date: 02/22/2021 Fluoroscopy was utilized by the requesting physician.  No radiographic interpretation.   DG Hip Unilat W or Wo Pelvis 2-3 Views Left  Result Date: 02/22/2021 CLINICAL DATA:  Fall EXAM: DG HIP (WITH OR WITHOUT PELVIS) 2-3V LEFT COMPARISON:  CT 01/06/2019. FINDINGS: Diffuse osteopenia. There is severe bilateral hip osteoarthritis with remodeling  of the femoral heads and acetabuli bilaterally. There is no definite acute fracture, evaluation limited due to remodeled bone. IMPRESSION: Severe bilateral hip osteoarthritis with bony remodeling. No definite acute fracture. Note that if the patient is unable to bear weight and clinical suspicion for hip fracture is significant, CT or MRI would be more sensitive. Electronically Signed   By:  Maurine Simmering [name]   On: 02/22/2021 08:13   DG FEMUR MIN 2 VIEWS LEFT  Result Date: 02/22/2021 CLINICAL DATA:  Bilateral intramedullary rod placement, femoral fracture EXAM: LEFT FEMUR 2 VIEWS COMPARISON:  02/22/2021 FINDINGS: Five fluoroscopic images are obtained during the performance of the procedure and provided for interpretation only. Intramedullary rod with proximal and distal interlocking screws traverses a distal left femoral fracture, with near anatomic alignment. Please refer to the operative report. FLUOROSCOPY TIME:  2 minutes 28 seconds, 17.21 mGy IMPRESSION: 1. ORIF distal left femoral fracture.  Near anatomic alignment. Electronically Signed   By: Randa Ngo [name]   On: 02/22/2021 18:43   DG FEMUR, MIN 2 VIEWS RIGHT  Result Date: 02/22/2021 CLINICAL DATA:  Bilateral intramedullary rod placement EXAM: RIGHT FEMUR 2 VIEWS COMPARISON:  02/22/2021 FINDINGS: Seven fluoroscopic images are obtained during the performance of the procedure and are provided for interpretation only. Comminuted distal right femoral fracture again identified. Intramedullary rod with proximal and distal screws placed across the fracture site, with near anatomic alignment. Please refer to the operative report. FLUOROSCOPY TIME:  2 minutes 28 seconds, 17.21 mGy IMPRESSION: 1. ORIF distal right femoral fracture.  Near anatomic alignment. Electronically Signed   By: Randa Ngo [name]   On: 02/22/2021 18:43   DG FEMUR, MIN 2 VIEWS RIGHT  Result Date: 02/22/2021 CLINICAL DATA:  88 year old female found down after fall. Right ankle deformity. EXAM: RIGHT FEMUR 2 VIEWS COMPARISON:  Bilateral hip series 12/31/2018. FINDINGS: Comminuted spiral fracture of the distal right femur shaft is partially visible. Comminution fragments up to 2 cm individually. Lateral angulation. Advanced chronic degeneration at the right hip which appears stable since 2020. Grossly intact visible right hemipelvis. Calcified femoral artery  atherosclerosis. IMPRESSION: 1. Comminuted spiral fracture of the distal right femur shaft is partially visible with lateral angulation and comminution. 2. Right hip appears stable since 2020. Electronically Signed   By: Genevie Ann [name]   On: 02/22/2021 07:09   DG FEMUR PORT MIN 2 VIEWS LEFT  Result Date: 02/22/2021 CLINICAL DATA:  Known distal left femoral fracture with fixation EXAM: LEFT FEMUR PORTABLE 2 VIEWS COMPARISON:  Intraoperative films from earlier in the same day. FINDINGS: Medullary rod is noted with proximal and distal fixation screws. Fracture fragments are in near anatomic alignment. Degenerative changes of the left hip joint are noted with remodeling of the femoral head stable from prior exams. IMPRESSION: Status post ORIF of distal left femoral fracture. Electronically Signed   By: Inez Catalina [name]   On: 02/22/2021 19:43   DG FEMUR PORT, MIN 2 VIEWS RIGHT  Result Date: 02/22/2021 CLINICAL DATA:  Bilateral femur fracture repairs EXAM: RIGHT FEMUR PORTABLE 2 VIEW COMPARISON:  X-ray intraoperative right femur 213123 the 6:43 p.m., x-ray right hip 1312 FINDINGS: Status post intramedullary nail fixation of a known comminuted femur fracture. Improved anatomical alignment. No radiographic findings to suggest surgical hardware complication. Tricompartmental degenerative changes of the knee. Redemonstration of marked degenerative changes of the right hip. No new acute displaced fracture or other focal bone lesions. Subcutaneus soft tissue edema. Vascular calcifications. IMPRESSION: 1. Status post intramedullary  nail fixation of a known femur fracture. 2. Marked degenerative changes of the right hip. Electronically Signed   By: Iven Finn [name]   On: 02/22/2021 19:21     Subjective: -no complaints   Discharge Exam: BP (!) 152/63 (BP Location: Left Arm)    Pulse 81    Temp 98.6 F (37 C) (Oral)    Resp 20    Ht 4\' 9"  (1.448 m)    Wt 54.4 kg    SpO2 99%    BMI 25.95 kg/m   General: Pt is  alert, awake, not in acute distress Cardiovascular: RRR, S1/S2 +, no rubs, no gallops Respiratory: CTA bilaterally, no wheezing, no rhonchi Abdominal: Soft, NT, ND, bowel sounds + Extremities: no edema, no cyanosis    The results of significant diagnostics from this hospitalization (including imaging, microbiology, ancillary and laboratory) are listed below for reference.     Microbiology: Recent Results (from the past 240 hour(s))  Resp Panel by RT-PCR (Flu A&B, Covid) Nasopharyngeal Swab     Status: None   Collection Time: 02/22/21  6:14 AM   Specimen: Nasopharyngeal Swab; Nasopharyngeal(NP) swabs in vial transport medium  Result Value Ref Range Status   SARS Coronavirus 2 by RT PCR NEGATIVE NEGATIVE Final    Comment: (NOTE) SARS-CoV-2 target nucleic acids are NOT DETECTED.  The SARS-CoV-2 RNA is generally detectable in upper respiratory specimens during the acute phase of infection. The lowest concentration of SARS-CoV-2 viral copies this assay can detect is 138 copies/mL. A negative result does not preclude SARS-Cov-2 infection and should not be used as the sole basis for treatment or other patient management decisions. A negative result may occur with  improper specimen collection/handling, submission of specimen other than nasopharyngeal swab, presence of viral mutation(s) within the areas targeted by this assay, and inadequate number of viral copies(<138 copies/mL). A negative result must be combined with clinical observations, patient history, and epidemiological information. The expected result is Negative.  Fact Sheet for Patients:  EntrepreneurPulse.com.au  Fact Sheet for Healthcare Providers:  IncredibleEmployment.be  This test is no t yet approved or cleared by the [place] FDA and  has been authorized for detection and/or diagnosis of SARS-CoV-2 by FDA under an Emergency Use Authorization (EUA). This EUA will remain  in  effect (meaning this test can be used) for the duration of the COVID-19 declaration under Section 564(b)(1) of the Act, 21 U.S.C.section 360bbb-3(b)(1), unless the authorization is terminated  or revoked sooner.       Influenza A by PCR NEGATIVE NEGATIVE Final   Influenza B by PCR NEGATIVE NEGATIVE Final    Comment: (NOTE) The Xpert Xpress SARS-CoV-2/FLU/RSV plus assay is intended as an aid in the diagnosis of influenza from Nasopharyngeal swab specimens and should not be used as a sole basis for treatment. Nasal washings and aspirates are unacceptable for Xpert Xpress SARS-CoV-2/FLU/RSV testing.  Fact Sheet for Patients: EntrepreneurPulse.com.au  Fact Sheet for Healthcare Providers: IncredibleEmployment.be  This test is not yet approved or cleared by the [place] FDA and has been authorized for detection and/or diagnosis of SARS-CoV-2 by FDA under an Emergency Use Authorization (EUA). This EUA will remain in effect (meaning this test can be used) for the duration of the COVID-19 declaration under Section 564(b)(1) of the Act, 21 U.S.C. section 360bbb-3(b)(1), unless the authorization is terminated or revoked.  Performed at [facility], [place] [address], [place], [place] [address]   SARS CORONAVIRUS 2 (TAT 6-24 HRS) Nasopharyngeal Nasopharyngeal Swab  Status: None   Collection Time: 02/27/21  9:48 PM   Specimen: Nasopharyngeal Swab  Result Value Ref Range Status   SARS Coronavirus 2 NEGATIVE NEGATIVE Final    Comment: (NOTE) SARS-CoV-2 target nucleic acids are NOT DETECTED.  The SARS-CoV-2 RNA is generally detectable in upper and lower respiratory specimens during the acute phase of infection. Negative results do not preclude SARS-CoV-2 infection, do not rule out co-infections with other pathogens, and should not be used as the sole basis for treatment or other patient management decisions. Negative results must be combined  with clinical observations, patient history, and epidemiological information. The expected result is Negative.  Fact Sheet for Patients: SugarRoll.be  Fact Sheet for Healthcare Providers: https://www.woods-mathews.com/  This test is not yet approved or cleared by the [place] FDA and  has been authorized for detection and/or diagnosis of SARS-CoV-2 by FDA under an Emergency Use Authorization (EUA). This EUA will remain  in effect (meaning this test can be used) for the duration of the COVID-19 declaration under Se ction 564(b)(1) of the Act, 21 U.S.C. section 360bbb-3(b)(1), unless the authorization is terminated or revoked sooner.  Performed at [facility], [place] [address], [place], [place] [address]      Labs: Basic Metabolic Panel: Recent Labs  Lab 02/23/21 0629 02/24/21 0202 02/25/21 0344 02/26/21 0411 02/27/21 1053  NA 139 135 137 135 138  K 4.1 4.3 4.4 4.1 3.8  CL 106 105 107 105 106  CO2 25 23 24 24 25   GLUCOSE 142* 119* 110* 102* 106*  BUN 23 36* 35* 24* 20  CREATININE 1.09* 1.72* 1.16* 0.84 0.72  CALCIUM 7.8* 7.4* 7.6* 8.2* 8.5*   Liver Function Tests: Recent Labs  Lab 02/25/21 0344  AST 18  ALT 13  ALKPHOS 51  BILITOT 0.3  PROT 4.8*  ALBUMIN 2.5*   CBC: Recent Labs  Lab 02/22/21 0613 02/22/21 1946 02/23/21 0941 02/23/21 2143 02/24/21 0202 02/24/21 1917 02/25/21 0344 02/26/21 0411 02/27/21 1053  WBC 10.1   < > CANCEL PER KEITH PAUL,PA/GINA,RN/TV1141  --  6.8  --  6.6 6.4 5.2  NEUTROABS 7.8*  --  PENDING  --   --   --   --   --   --   HGB 11.0*   < > CANCEL PER KEITH PAUL,PA/GINA,RN/TV1141   < > 7.6* 9.0* 8.6* 8.8* 9.3*  HCT 33.7*   < > CANCEL PER KEITH PAUL,PA/GINA,RN/TV1141   < > 21.6* 26.3* 24.9* 27.0* 27.8*  MCV 92.6   < > CANCEL PER KEITH PAUL,PA/GINA,RN/TV1141  --  88.5  --  91.2 93.4 93.3  PLT 313   < > CANCEL PER KEITH PAUL,PA/GINA,RN/TV1141  --  85*  --  93* 127* 147*   < > =  values in this interval not displayed.   CBG: No results for input(s): GLUCAP in the last 168 hours. Hgb A1c No results for input(s): HGBA1C in the last 72 hours. Lipid Profile No results for input(s): CHOL, HDL, LDLCALC, TRIG, CHOLHDL, LDLDIRECT in the last 72 hours. Thyroid function studies No results for input(s): TSH, T4TOTAL, T3FREE, THYROIDAB in the last 72 hours.  Invalid input(s): FREET3 Urinalysis    Component Value Date/Time   COLORURINE AMBER (A) 02/28/2020 2252   APPEARANCEUR CLOUDY (A) 02/28/2020 2252   LABSPEC 1.019 02/28/2020 2252   PHURINE 6.0 02/28/2020 2252   GLUCOSEU NEGATIVE 02/28/2020 2252   HGBUR NEGATIVE 02/28/2020 2252   BILIRUBINUR NEGATIVE 02/28/2020 2252   [place] 02/28/2020  2252   PROTEINUR 30 (A) 02/28/2020 2252   UROBILINOGEN 0.2 07/13/2009 1622   NITRITE NEGATIVE 02/28/2020 2252   LEUKOCYTESUR NEGATIVE 02/28/2020 2252    FURTHER DISCHARGE INSTRUCTIONS:   Get Medicines reviewed and adjusted: Please take all your medications with you for your next visit with your Primary MD   Laboratory/radiological data: Please request your Primary MD to go over all hospital tests and procedure/radiological results at the follow up, please ask your Primary MD to get all Hospital records sent to his/her office.   In some cases, they will be blood work, cultures and biopsy results pending at the time of your discharge. Please request that your primary care [name] goes through all the records of your hospital data and follows up on these results.   Also Note the following: If you experience worsening of your admission symptoms, develop shortness of breath, life threatening emergency, suicidal or homicidal thoughts you must seek medical attention immediately by calling 911 or calling your MD immediately  if symptoms less severe.   You must read complete instructions/literature along with all the possible adverse reactions/side effects for all the Medicines  you take and that have been prescribed to you. Take any new Medicines after you have completely understood and accpet all the possible adverse reactions/side effects.    Do not drive when taking Pain medications or sleeping medications (Benzodaizepines)   Do not take more than prescribed Pain, Sleep and Anxiety Medications. It is not advisable to combine anxiety,sleep and pain medications without talking with your primary care practitioner   Special Instructions: If you have smoked or chewed Tobacco  in the last 2 yrs please stop smoking, stop any regular Alcohol  and or any Recreational drug use.   Wear Seat belts while driving.   Please note: You were cared for by a hospitalist during your hospital stay. Once you are discharged, your primary care physician will handle any further medical issues. Please note that NO REFILLS for any discharge medications will be authorized once you are discharged, as it is imperative that you return to your primary care physician (or establish a relationship with a primary care physician if you do not have one) for your post hospital discharge needs so that they can reassess your need for medications and monitor your lab values.  Time coordinating discharge: 35 minutes  SIGNED:  Marzetta Board, MD, PhD 02/28/2021, 7:21 AM

## 2021-02-28 NOTE — NC FL2 (Signed)
[place] LEVEL OF CARE SCREENING TOOL     IDENTIFICATION  Patient Name: Emily Camacho Birthdate: 05/27/1928 Sex: female Admission Date (Current Location): 02/22/2021  [facility] and [place] Number:  Herbalist and Address:  [facility], [place] [address], [place], [place] [address]      Provider Number: [id number]  Attending Physician Name and Address:  Caren Griffins, MD  Relative Name and Phone Number:       Current Level of Care: Hospital Recommended Level of Care: [place] Prior Approval Number:    Date Approved/Denied:   PASRR Number: [id number]  Discharge Plan:      Current Diagnoses: Patient Active Problem List   Diagnosis Date Noted   Closed displaced spiral fracture of shaft of femur ([place]) 02/22/2021   Bedbound 02/22/2021   Closed fracture of left distal femur ([place]) 02/22/2021   Hyperglycemia 02/22/2021   Femur fracture ([place]) 02/22/2021   Osteoporosis with pathological fracture of multiple sites 02/22/2021   Traumatic rhabdomyolysis ([place])    Pressure injury of skin 01/04/2019   Malnutrition of moderate degree 01/03/2019   Fall    Goals of care, counseling/discussion    Palliative care by specialist    DNR (do not resuscitate) discussion    AKI (acute kidney injury) ([place])    Dehydration 12/31/2018   CARDIOMYOPATHY 09/20/2009   Hyperlipidemia 09/15/2009   Essential hypertension 09/15/2009   GERD 09/15/2009   ARTHRITIS 09/15/2009   EPISTAXIS 09/15/2009   CHEST PAIN 09/15/2009    Orientation RESPIRATION BLADDER Height & Weight     Self  Normal External catheter, Incontinent Weight: 119 lb 14.9 oz (54.4 kg) Height:  4\' 9"  (144.8 cm)  BEHAVIORAL SYMPTOMS/MOOD NEUROLOGICAL BOWEL NUTRITION STATUS      Continent Diet (please see discharge summary)  AMBULATORY STATUS COMMUNICATION OF NEEDS Skin     Verbally Surgical wounds (pressure injury;mid sacrum unstagebale, pressure injury RT foot  lateral deep tissue, closed incision RT & LF leg thigh anterior, RT & LF knee anterior, RT & LF knee lateral)                       Personal Care Assistance Level of Assistance  Bathing, Feeding, Dressing Bathing Assistance: Maximum assistance Feeding assistance: Limited assistance Dressing Assistance: Maximum assistance Total Care Assistance: Maximum assistance   Functional Limitations Info  Sight, Hearing, Speech Sight Info: Adequate Hearing Info: Adequate Speech Info: Adequate    SPECIAL CARE FACTORS FREQUENCY                       Contractures Contractures Info: Not present    Additional Factors Info  Code Status, Allergies Code Status Info: DNR Allergies Info: Penicillins,Sulfonamide drivatives,Latex           Current Medications (02/28/2021):  This is the current hospital active medication list Current Facility-Administered Medications  Medication Dose Route Frequency Provider Last Rate Last Admin   0.9 %  sodium chloride infusion  10 mL/hr Intravenous Once Oleta Mouse, MD       acetaminophen (TYLENOL) tablet 650 mg  650 mg Oral Q8H Ainsley Spinner, PA-C   650 mg at 02/28/21 0448   acetaminophen (TYLENOL) tablet 650 mg  650 mg Oral Once Ainsley Spinner, PA-C       docusate sodium (COLACE) capsule 100 mg  100 mg Oral BID Ainsley Spinner, PA-C   100 mg at 02/28/21 0844   enoxaparin (LOVENOX)  injection 30 mg  30 mg Subcutaneous Q24H Caren Griffins, MD   30 mg at 02/28/21 0844   feeding supplement (ENSURE ENLIVE / ENSURE PLUS) liquid 237 mL  237 mL Oral TID Fuller Plan A, MD   237 mL at 02/28/21 0844   furosemide (LASIX) injection 20 mg  20 mg Intravenous Once Ainsley Spinner, PA-C       hydrALAZINE (APRESOLINE) injection 10 mg  10 mg Intravenous Q4H PRN Caren Griffins, MD       HYDROcodone-acetaminophen (NORCO/VICODIN) 5-325 MG per tablet 1 tablet  1 tablet Oral Q6H PRN Norval Morton, MD   1 tablet at 02/28/21 0845   levothyroxine (SYNTHROID) tablet 25 mcg   25 mcg Oral Daily Fuller Plan A, MD   25 mcg at 02/28/21 0448   lisinopril (ZESTRIL) tablet 20 mg  20 mg Oral Daily Caren Griffins, MD   20 mg at 02/28/21 0844   menthol-cetylpyridinium (CEPACOL) lozenge 3 mg  1 lozenge Oral PRN Ainsley Spinner, PA-C       Or   phenol (CHLORASEPTIC) mouth spray 1 spray  1 spray Mouth/Throat PRN Ainsley Spinner, PA-C       metoCLOPramide (REGLAN) tablet 5-10 mg  5-10 mg Oral Q8H PRN Ainsley Spinner, PA-C       Or   metoCLOPramide (REGLAN) injection 5-10 mg  5-10 mg Intravenous Q8H PRN Ainsley Spinner, PA-C       metoprolol succinate (TOPROL-XL) 24 hr tablet 25 mg  25 mg Oral Daily Caren Griffins, MD   25 mg at 02/28/21 0843   morphine 2 MG/ML injection 0.5 mg  0.5 mg Intravenous Q2H PRN Fuller Plan A, MD   0.5 mg at 02/25/21 1320   ondansetron (ZOFRAN) tablet 4 mg  4 mg Oral Q6H PRN Ainsley Spinner, PA-C       Or   ondansetron [facility]) injection 4 mg  4 mg Intravenous Q6H PRN Ainsley Spinner, PA-C       oxybutynin (DITROPAN-XL) 24 hr tablet 5 mg  5 mg Oral Daily Tamala Julian, Rondell A, MD   5 mg at 02/28/21 0844   rosuvastatin (CRESTOR) tablet 5 mg  5 mg Oral QHS Smith, Rondell A, MD   5 mg at 02/27/21 2134   zinc oxide 20 % ointment 1 application  1 application Topical BID Norval Morton, MD   1 application at 0000000 0844   zinc oxide 20 % ointment 1 application  1 application Topical PRN Norval Morton, MD         Discharge Medications: Please see discharge summary for a list of discharge medications.  Relevant Imaging Results:  Relevant Lab Results:   Additional Information SSN [id number]  Vinie Sill, LCSW

## 2021-02-28 NOTE — Progress Notes (Signed)
Orthopaedic Trauma Service Progress Note  Patient ID: Emily Camacho MRN: [id number] DOB/AGE: 86/12/1928 86 y.o.  Subjective:  No acute issues SNF today   ROS  Objective:   VITALS:   Vitals:   02/27/21 2330 02/28/21 0312 02/28/21 0749 02/28/21 1209  BP: (!) 156/75 (!) 152/63 (!) 149/88 (!) 167/78  Pulse: 96 81 82 77  Resp: 20  18 19   Temp: 98 F (36.7 C) 98.6 F (37 C) 98.3 F (36.8 C) 98.6 F (37 C)  TempSrc: Axillary Oral Oral Oral  SpO2: 99%     Weight:      Height:        Estimated body mass index is 25.95 kg/m as calculated from the following:   Height as of this encounter: 4\' 9"  (1.448 m).   Weight as of this encounter: 54.4 kg.   Intake/Output      02/05 0701 02/06 0700 02/06 0701 02/07 0700   P.O.     Total Intake(mL/kg)     Urine (mL/kg/hr) 200 (0.2)    Total Output 200    Net -200           LABS  Results for orders placed or performed during the hospital encounter of 02/22/21 (from the past 24 hour(s))  SARS CORONAVIRUS 2 (TAT 6-24 HRS) Nasopharyngeal Nasopharyngeal Swab     Status: None   Collection Time: 02/27/21  9:48 PM   Specimen: Nasopharyngeal Swab  Result Value Ref Range   SARS Coronavirus 2 NEGATIVE NEGATIVE     PHYSICAL EXAM:   Gen: sitting up in bed, pleasant  Lungs: unlabored Ext:       B Lower Extremities              Dressings B legs stable                         dressings clean                          Chronic deformities noted to R leg                          Valgus resting position B legs                         Mild swelling to legs                         Motor and sensory functions appear to be grossly intact                         Exts are warm                          + DP pulses Assessment/Plan: 6 Days Post-Op    Anti-infectives (From admission, onward)    Start     Dose/Rate Route Frequency Ordered Stop   02/23/21 0000   clindamycin (CLEOCIN) IVPB 600 mg        600 mg 100 mL/hr over 30 Minutes Intravenous Every 6 hours 02/22/21 2331 02/23/21 1159   02/22/21 1400  vancomycin (  VANCOCIN) IVPB 1000 mg/200 mL premix        1,000 mg 200 mL/hr over 60 Minutes Intravenous On call to O.R. 02/22/21 1342 02/22/21 1521     .  POD/HD#: 42  88 y/o female s/p fall with B femur fractures    -B femur fractures, non-ambulator s/p B retrograde IMN             NWB B LEX             Motion as tolerated                          Pt has severe arthritis B hips                          Contracture R knee joint---> knee moves to no more than 50 degrees                                      Therapy evals             Dressing changes as needed                Compressing socks for swelling    - Pain management:             Multimodal             Minimize narcotics   - ABL anemia/Hemodynamics            improved, stable    - Medical issues              Thrombocytopenia---> resolved                          - DVT/PE prophylaxis:             Lovenox x 21 days   - ID:              Periop abx completed    - Metabolic Bone Disease:             B femur fragility fractures = osteoporosis              Check vitamin d levels             Has been non-ambulatory for 2 years so bone quality is extremely poor    - Activity:             Bed to chair with assist                 - Impediments to fracture healing:             Poor bone quality/osteoporosis               - Dispo:             follow up with ortho in 2 weeks                  Jari Pigg, PA-C [phone] (C) 02/28/2021, 12:32 PM  [facility] [place] [address] [phone] Jenetta [phone] (F)    After 5pm and on the weekends please log on to Amion, go to orthopaedics and the look under the [facility] Call for the provider(s) on call. You can also  call our office at [phone] and then follow the  prompts to be connected to the call team.   Patient ID: Emily Camacho, female   DOB: 1928/01/31, 88 y.o.   MRN: [id number]

## 2023-07-24 IMAGING — CR DG HIP (WITH OR WITHOUT PELVIS) 2-3V*L*
3 series · 3 of 3 positions shown · non-contrast
Comparison: CT 01/06/2019.

CLINICAL DATA: Fall

EXAM:
DG HIP (WITH OR WITHOUT PELVIS) 2-3V LEFT

[pelvis ap]
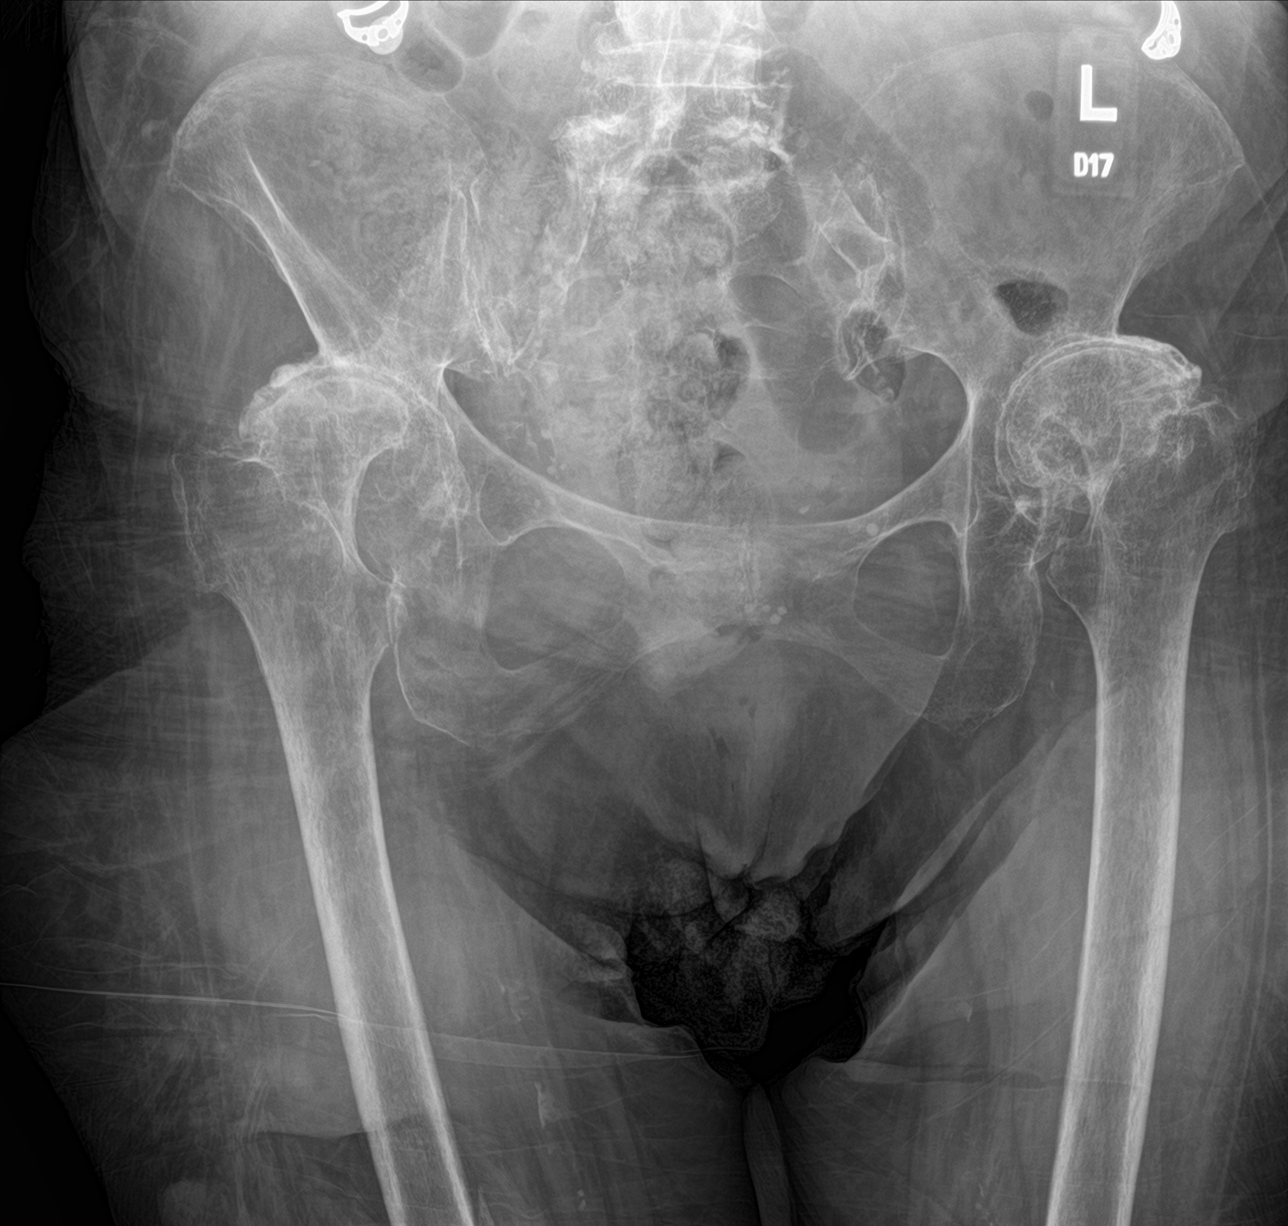

[hip ap]
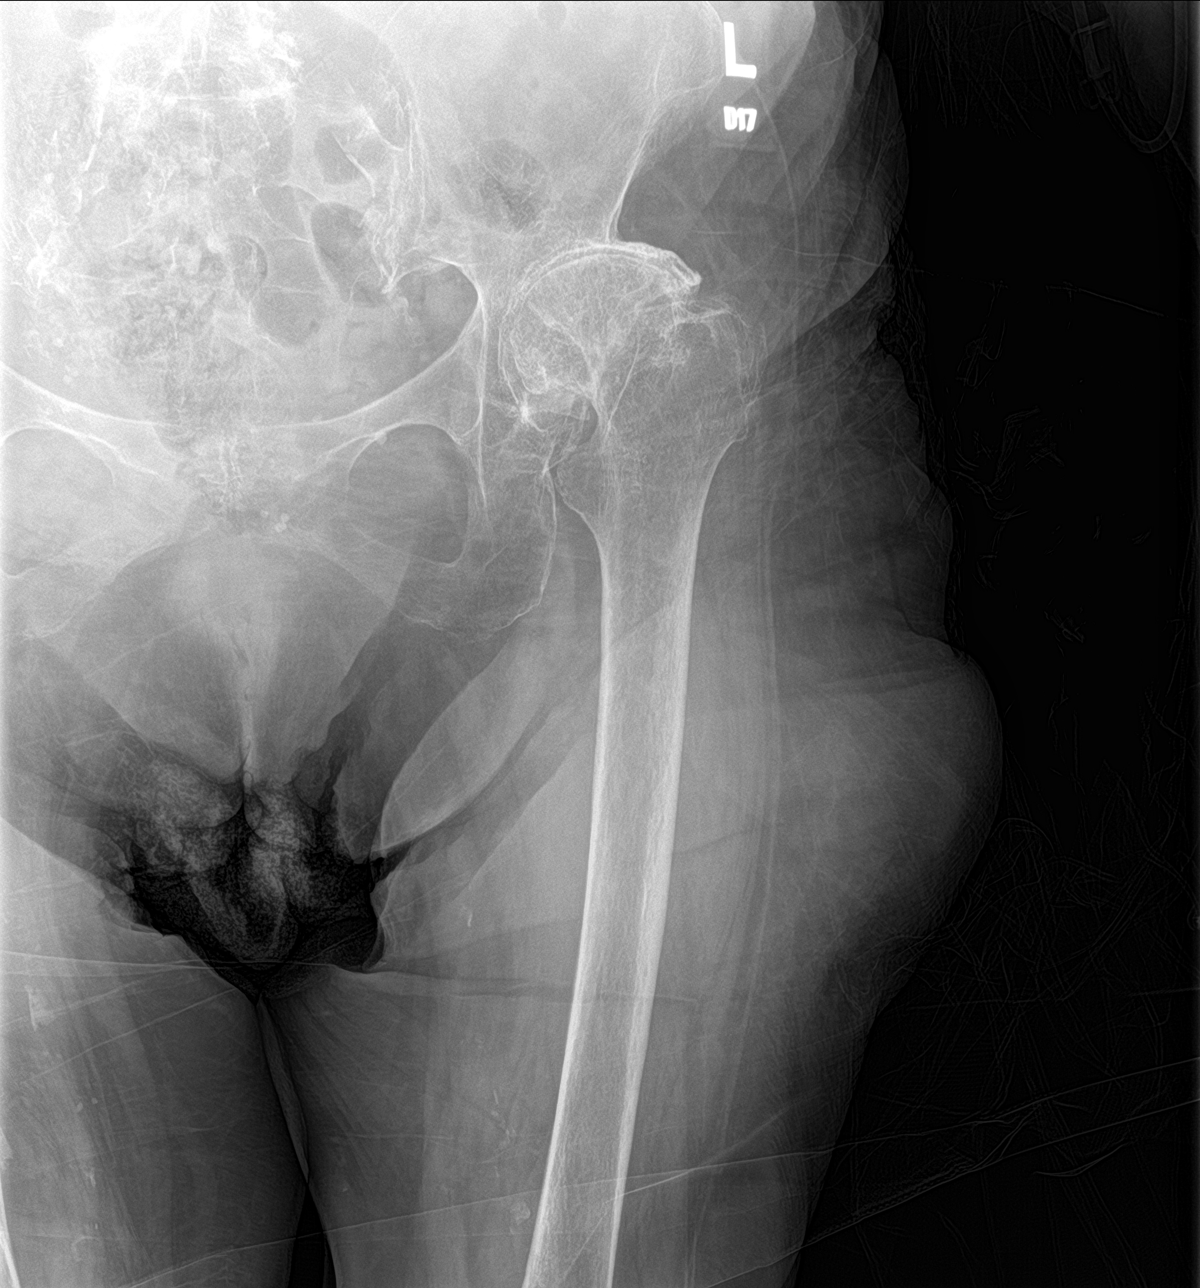

[hip x-table]
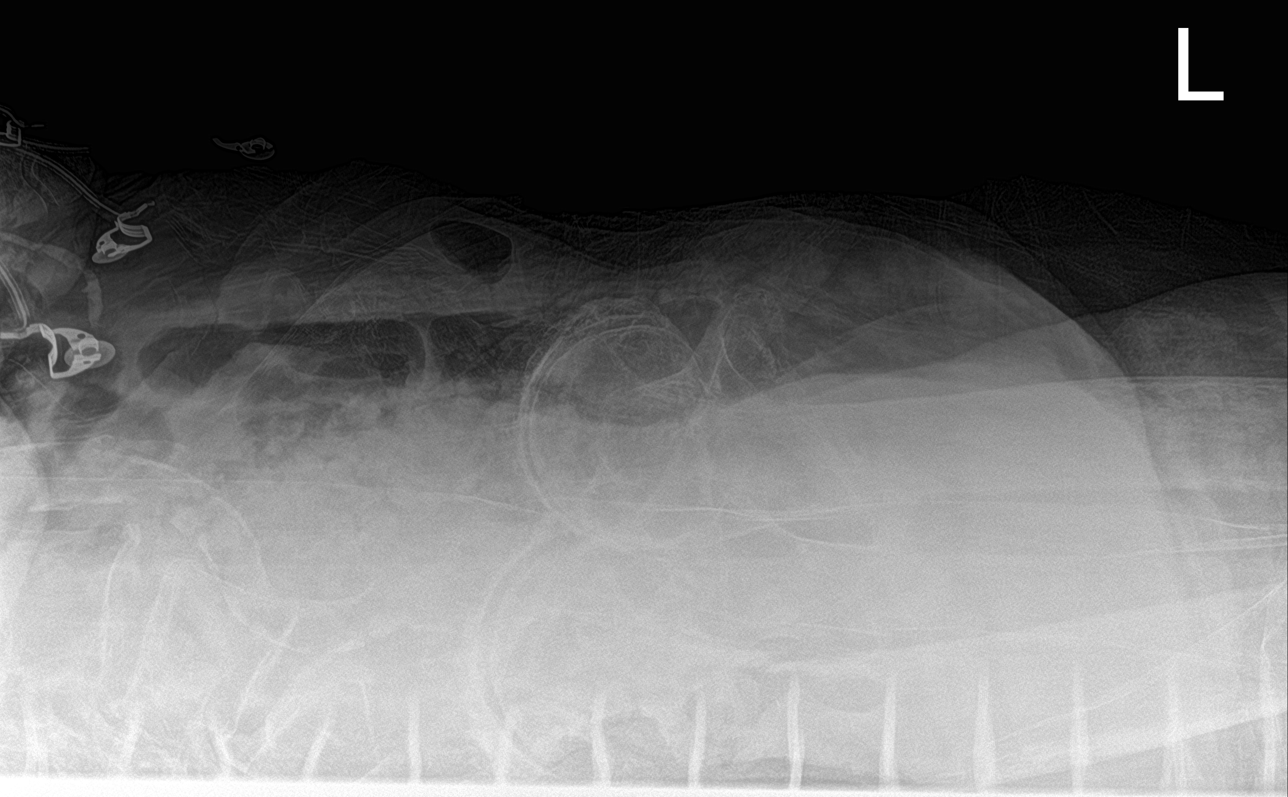

[3 of 3 positions shown; findings below may reference images not displayed]

FINDINGS: Diffuse osteopenia. There is severe bilateral hip osteoarthritis
with remodeling of the femoral heads and acetabuli bilaterally.
There is no definite acute fracture, evaluation limited due to
remodeled bone.
IMPRESSION: Severe bilateral hip osteoarthritis with bony remodeling. No
definite acute fracture. Note that if the patient is unable to bear
weight and clinical suspicion for hip fracture is significant, CT or
MRI would be more sensitive.
